# Patient Record
Sex: Female | Born: 1995 | Hispanic: Yes | Marital: Single | Smoking: Never smoker
Health system: Southern US, Community
[De-identification: ages and names within clinical notes are randomized; demographics above are authoritative.]

## PROBLEM LIST (undated history)

## (undated) ENCOUNTER — Inpatient Hospital Stay (HOSPITAL_COMMUNITY): Payer: Self-pay

## (undated) DIAGNOSIS — J45909 Unspecified asthma, uncomplicated: Secondary | ICD-10-CM

## (undated) DIAGNOSIS — A749 Chlamydial infection, unspecified: Secondary | ICD-10-CM

## (undated) HISTORY — PX: NO PAST SURGERIES: SHX2092

## (undated) HISTORY — DX: Chlamydial infection, unspecified: A74.9

---
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
## 2017-01-04 ENCOUNTER — Other Ambulatory Visit (INDEPENDENT_AMBULATORY_CARE_PROVIDER_SITE_OTHER): Payer: Medicaid Other | Admitting: *Deleted

## 2017-01-04 DIAGNOSIS — Z9889 Other specified postprocedural states: Secondary | ICD-10-CM

## 2017-01-04 NOTE — Progress Notes (Signed)
Pt here 2 weeks post partum for c-section incision check.  Area is well closed without drainage.  No redness or tender noted.  Pt is aware that the numbness will continue to be there but will get some better over time.  Appt for her PP check was made.  Pt will call if she has any further issues.

## 2017-01-17 ENCOUNTER — Encounter: Payer: Self-pay | Admitting: Obstetrics and Gynecology

## 2017-01-17 ENCOUNTER — Ambulatory Visit (INDEPENDENT_AMBULATORY_CARE_PROVIDER_SITE_OTHER): Payer: Medicaid Other | Admitting: Obstetrics and Gynecology

## 2017-01-17 DIAGNOSIS — Z3202 Encounter for pregnancy test, result negative: Secondary | ICD-10-CM

## 2017-01-17 DIAGNOSIS — Z3049 Encounter for surveillance of other contraceptives: Secondary | ICD-10-CM

## 2017-01-17 DIAGNOSIS — Z98891 History of uterine scar from previous surgery: Secondary | ICD-10-CM

## 2017-01-17 DIAGNOSIS — Z30017 Encounter for initial prescription of implantable subdermal contraceptive: Secondary | ICD-10-CM

## 2017-01-17 DIAGNOSIS — Z1389 Encounter for screening for other disorder: Secondary | ICD-10-CM | POA: Diagnosis not present

## 2017-01-17 DIAGNOSIS — Z01812 Encounter for preprocedural laboratory examination: Secondary | ICD-10-CM

## 2017-01-17 LAB — POCT URINE PREGNANCY: PREG TEST UR: NEGATIVE

## 2017-01-17 MED ORDER — ETONOGESTREL 68 MG ~~LOC~~ IMPL
68.0000 mg | DRUG_IMPLANT | Freq: Once | SUBCUTANEOUS | Status: AC
  Administered 2017-01-17: 68 mg via SUBCUTANEOUS

## 2017-01-17 NOTE — Progress Notes (Signed)
Subjective:     Cheyenne Garcia is a 21 y.o. female who presents for a postpartum visit. She is 4 weeks postpartum following a low cervical transverse Cesarean section. I have fully reviewed the prenatal and intrapartum course. The delivery was at 38 gestational weeks complicated by [facility] and IUGR. Outcome: primary cesarean section, low transverse incision. Anesthesia: spinal. Postpartum course has been uncomplicated. Baby's course has been uncomplicated. Baby is feeding by bottle Rush Barer- Gerber. Bleeding no bleeding. Bowel function is normal. Bladder function is normal. Patient is not sexually active. Contraception method is abstinence. Postpartum depression screening: negative.     Review of Systems Pertinent items are noted in HPI.   Objective:    Ht 5' (1.524 m)   Wt 119 lb (54 kg)   LMP 03/30/2016   Breastfeeding? No   BMI 23.24 kg/m   General:  alert, cooperative and no distress   Breasts:  inspection negative, no nipple discharge or bleeding, no masses or nodularity palpable  Lungs: clear to auscultation bilaterally  Heart:  regular rate and rhythm  Abdomen: soft, non-tender; bowel sounds normal; no masses,  no organomegaly and incision: healing well, without erythema, induration or drainage   Vulva:  normal  Vagina: normal vagina, no discharge, exudate, lesion, or erythema  Cervix:  multiparous appearance  Corpus: normal size, contour, position, consistency, mobility, non-tender  Adnexa:  normal adnexa and no mass, fullness, tenderness  Rectal Exam: Not performed.        Assessment:     Normal postpartum exam. Pap smear not done at today's visit.   Plan:    1. Contraception: Nexplanon  Patient given informed consent, signed copy in the chart, time out was performed. Pregnancy test was negative. Patient informed of irregular bleeding pattern associate with Nexplanon which does not affect its effectiveness. Patient advised to keep device in place for at least a year. Patient to use  condoms for the next 3 weeks Appropriate time out taken.  Patient's left arm was prepped and draped in the usual sterile fashion.. The ruler used to measure and mark insertion area.  Patient was prepped with alcohol swab and then injected with 3 cc of 1% lidocaine with epinephrine.  Patient was prepped with betadine, Nexplanon removed form packaging.  Device confirmed in needle, then inserted full length of needle and withdrawn per handbook instructions.  Patient insertion site covered with steri-strip and pressure bandage.   Minimal blood loss.  Patient tolerated the procedure well.   2. Patient is cleared to return to normal activities in 2 weeks.  3. Follow up in: 1 year or as needed.

## 2017-01-17 NOTE — Addendum Note (Signed)
Addended by: Granville LewisLARK, Subrina Vecchiarelli L on: 01/17/2017 03:25 PM   Modules accepted: Orders

## 2017-01-18 ENCOUNTER — Ambulatory Visit: Payer: Medicaid Other | Admitting: Obstetrics & Gynecology

## 2017-08-27 ENCOUNTER — Ambulatory Visit (INDEPENDENT_AMBULATORY_CARE_PROVIDER_SITE_OTHER): Payer: Self-pay | Admitting: Obstetrics & Gynecology

## 2017-08-27 ENCOUNTER — Encounter: Payer: Self-pay | Admitting: Obstetrics & Gynecology

## 2017-08-27 ENCOUNTER — Encounter (INDEPENDENT_AMBULATORY_CARE_PROVIDER_SITE_OTHER): Payer: Self-pay

## 2017-08-27 VITALS — BP 109/69 | HR 81 | Resp 16 | Ht 60.0 in | Wt 105.0 lb

## 2017-08-27 DIAGNOSIS — N644 Mastodynia: Secondary | ICD-10-CM

## 2017-08-27 NOTE — Progress Notes (Signed)
   Subjective:    Patient ID: Cheyenne Garcia, female    DOB: 05/28/1995, 22 y.o.   MRN: [id number]  HPI 22 yo P701 (288 month old son) here because of a 1 week h/o upper left breast discomfort with "running", "lifting things". She reports that the pain stops when she quits the activity that caused the pain. She has not taken any IBU or tylenol.  She breast fed her son for 2 months, uses Nexplanon for contraception. She denies any breast discharge, skin changes, or family history of breast cancer.    Review of Systems     Objective:   Physical Exam Breathing, conversing, and ambulating normally Well nourished, well hydrated Latina, no apparent distress  Her breasts appear normal. There are no masses, lymphadenopathy, nipple discharge, or skin changes.  When I palpate cephalad to the breast tissue she experiences some discomfort.     Assessment & Plan:  Probable musculoskeletal etiology of her discomfort I have rec'd RTC IBU 400 mg TID If her pain has not resolved by next week, I would rec that she come back

## 2020-02-03 ENCOUNTER — Other Ambulatory Visit: Payer: Self-pay

## 2020-02-03 DIAGNOSIS — U071 COVID-19: Secondary | ICD-10-CM | POA: Diagnosis not present

## 2020-02-06 DIAGNOSIS — Z419 Encounter for procedure for purposes other than remedying health state, unspecified: Secondary | ICD-10-CM | POA: Diagnosis not present

## 2020-02-21 DIAGNOSIS — H5213 Myopia, bilateral: Secondary | ICD-10-CM | POA: Diagnosis not present

## 2020-03-08 DIAGNOSIS — Z419 Encounter for procedure for purposes other than remedying health state, unspecified: Secondary | ICD-10-CM | POA: Diagnosis not present

## 2020-03-15 ENCOUNTER — Ambulatory Visit: Payer: Medicaid Other | Admitting: Certified Nurse Midwife

## 2020-03-18 ENCOUNTER — Encounter: Payer: Self-pay | Admitting: Certified Nurse Midwife

## 2020-03-18 ENCOUNTER — Ambulatory Visit: Payer: Medicaid Other | Admitting: Certified Nurse Midwife

## 2020-03-18 ENCOUNTER — Other Ambulatory Visit: Payer: Self-pay

## 2020-03-18 VITALS — BP 110/70 | HR 92 | Resp 16 | Ht 60.0 in | Wt 102.0 lb

## 2020-03-18 DIAGNOSIS — Z01812 Encounter for preprocedural laboratory examination: Secondary | ICD-10-CM

## 2020-03-18 DIAGNOSIS — Z30017 Encounter for initial prescription of implantable subdermal contraceptive: Secondary | ICD-10-CM

## 2020-03-18 DIAGNOSIS — Z3202 Encounter for pregnancy test, result negative: Secondary | ICD-10-CM | POA: Diagnosis not present

## 2020-03-18 DIAGNOSIS — Z3046 Encounter for surveillance of implantable subdermal contraceptive: Secondary | ICD-10-CM | POA: Diagnosis not present

## 2020-03-18 LAB — POCT URINE PREGNANCY: Preg Test, Ur: NEGATIVE

## 2020-03-18 MED ORDER — ETONOGESTREL 68 MG ~~LOC~~ IMPL
68.0000 mg | DRUG_IMPLANT | Freq: Once | SUBCUTANEOUS | Status: AC
  Administered 2020-03-18: 68 mg via SUBCUTANEOUS

## 2020-03-18 NOTE — Progress Notes (Signed)
   GYNECOLOGY OFFICE VISIT NOTE  History:  25 y.o. G1P1001 here today for Nexplanon removal and reinsertion. Had last Nexplanon placed 01/17/2017. She denies any abnormal vaginal discharge, bleeding, pelvic pain or other concerns.   Past Medical History:  Diagnosis Date  . Asthma   . Chlamydia     Past Surgical History:  Procedure Laterality Date  . CESAREAN SECTION N/A 12/22/2016   Procedure: CESAREAN SECTION;  Surgeon: Tereso Newcomer, MD;  [facility];  Service: Obstetrics;  Laterality: N/A;  . NO PAST SURGERIES      The following portions of the patient's history were reviewed and updated as appropriate: allergies, current medications, past family history, past medical history, past social history, past surgical history and problem list.   Review of Systems:  Negative except noted in HPI  Objective:  Physical Exam BP 110/70   Pulse 92   Resp 16   Ht 5' (1.524 m)   Wt 102 lb (46.3 kg)   Breastfeeding No   BMI 19.92 kg/m  CONSTITUTIONAL: Well-developed, well-nourished female in no acute distress.  HENT:  Normocephalic, atraumatic EYES: Conjunctivae and EOM are normal NECK: Normal range of motion SKIN: Skin is warm and dry NEUROLOGIC: Alert and oriented to person, place, and time PSYCHIATRIC: Normal mood and affect CARDIOVASCULAR: Normal heart rate noted RESPIRATORY: Effort and rate normal MUSCULOSKELETAL: Normal range of motion  Labs and Imaging Results for orders placed or performed in visit on 03/18/20 (from the past 24 hour(s))  POCT urine pregnancy     Status: Normal   Collection Time: 03/18/20  9:59 AM  Result Value Ref Range   Preg Test, Ur Negative Negative   GYNECOLOGY PROCEDURE NOTE  Nexplanon Removal and Insertion  Patient identified, informed consent performed, consent signed. Patient does understand that irregular bleeding is a very common side effect of this medication. She was advised to have backup contraception for one week  after replacement of the implant. Pregnancy test in clinic today was negative.  Appropriate time out taken. Implanon site identified. Area prepped in usual sterile fashon. One ml of 1% lidocaine was used to anesthetize the area at the distal end of the implant. A small stab incision was made right beside the implant on the distal portion. The Nexplanon rod was grasped using hemostats and removed without difficulty. There was minimal blood loss. There were no complications.   The new area was then injected with 3 ml of 1 % lidocaine. She was re-prepped with betadine, Nexplanon removed from packaging, device confirmed in needle, then inserted full length of needle and withdrawn per handbook instructions. Nexplanon was able to palpated in the patient's arm; patient palpated the insert herself. There was minimal blood loss. Patient insertion site covered with gauze and a pressure bandage to reduce any bruising. The patient tolerated the procedure well and was given post procedure instructions.  She was advised to have backup contraception for one week.    Donette Larry, CNM [facility] [employer], [facility]   Assessment & Plan:   1. Nexplanon removal   2. Nexplanon insertion    Follow up for annual and papsmear   Donette Larry, [place] 03/18/2020 2:17 PM

## 2020-04-05 DIAGNOSIS — Z419 Encounter for procedure for purposes other than remedying health state, unspecified: Secondary | ICD-10-CM | POA: Diagnosis not present

## 2020-04-26 ENCOUNTER — Other Ambulatory Visit: Payer: Self-pay

## 2020-04-26 NOTE — Patient Instructions (Signed)
Visit Information  Ms. Cheyenne Garcia  - as a part of your Medicaid benefit, you are eligible for care management and care coordination services at no cost or copay. I was unable to reach you by phone today but would be happy to help you with your health related needs. Please feel free to call me @ [phone].   A member of the Managed Medicaid care management team will reach out to you again over the next 7 days.   Alexis Shields, BSW, MHA [facility]  [place]  High Risk Managed Medicaid Team    

## 2020-04-26 NOTE — Patient Outreach (Signed)
Care Coordination  04/26/2020  Cheyenne Garcia 1995-11-09 [id number]   Medicaid Managed Care   Unsuccessful Outreach Note  04/26/2020 Name: Cheyenne Garcia MRN: [id number] DOB: 1996-01-29  Referred by: Patient, No Pcp Per Reason for referral : High Risk Managed Medicaid (MM Screen Unsuccessful Telephone Outreach)   An unsuccessful telephone outreach was attempted today. The patient was referred to the case management team for assistance with care management and care coordination.   Follow Up Plan: The care management team will reach out to the patient again over the next 7 days.   Gus Puma, BSW, [place] [facility]  [place]  High Risk Managed Medicaid Team

## 2020-05-05 ENCOUNTER — Other Ambulatory Visit: Payer: Self-pay

## 2020-05-05 NOTE — Patient Outreach (Signed)
Care Coordination  05/05/2020  Cheyenne Garcia 07/26/1995 [id number]   Medicaid Managed Care   Unsuccessful Outreach Note  05/05/2020 Name: Cheyenne Garcia MRN: [id number] DOB: 07/29/1995  Referred by: Patient, No Pcp Per (Inactive) Reason for referral : High Risk Managed Medicaid (MM Screen Unsuccessful Telephone Outreach)   A second unsuccessful telephone outreach was attempted today. The patient was referred to the case management team for assistance with care management and care coordination.   Follow Up Plan: The care management team will reach out to the patient again over the next 7 days.   Gus Puma, BSW, [place] [facility]  [place]  High Risk Managed Medicaid Team

## 2020-05-05 NOTE — Patient Instructions (Signed)
Visit Information  Ms. Cheyenne Garcia  - as a part of your Medicaid benefit, you are eligible for care management and care coordination services at no cost or copay. I was unable to reach you by phone today but would be happy to help you with your health related needs. Please feel free to call me @ [phone].   A member of the Managed Medicaid care management team will reach out to you again over the next 7 days.   Gus Puma, BSW, [place] [facility]  [place]  High Risk Managed Medicaid Team

## 2020-05-06 DIAGNOSIS — Z419 Encounter for procedure for purposes other than remedying health state, unspecified: Secondary | ICD-10-CM | POA: Diagnosis not present

## 2020-05-16 ENCOUNTER — Telehealth: Payer: Self-pay

## 2020-05-16 NOTE — Patient Outreach (Signed)
Care Coordination  05/16/2020  Cheyenne Garcia Apr 04, 1995 [id number]   Medicaid Managed Care   Unsuccessful Outreach Note  05/16/2020 Name: Cheyenne Garcia MRN: [id number] DOB: 1995-12-08  Referred by: Patient, No Pcp Per (Inactive) Reason for referral : High Risk Managed Medicaid (MM Screen Social Work)   Third unsuccessful telephone outreach was attempted today. The patient was referred to the case management team for assistance with care management and care coordination. The patient's primary care provider has been notified of our unsuccessful attempts to make or maintain contact with the patient. The care management team is pleased to engage with this patient at any time in the future should he/she be interested in assistance from the care management team.   Follow Up Plan: If patient returns call to provider office, please advise to call Embedded Care Management Care Guide [phone]* at social worker *  Gus Puma, BSW, [place] [facility]  [place]  High Risk Managed Medicaid Team

## 2020-05-16 NOTE — Patient Instructions (Signed)
Visit Information  Ms. Cheyenne Garcia  - as a part of your Medicaid benefit, you are eligible for care management and care coordination services at no cost or copay. I was unable to reach you by phone today but would be happy to help you with your health related needs. Please feel free to call me @ [phone].      Gus Puma, BSW, [place] [facility]  [place]  High Risk Managed Medicaid Team

## 2020-06-05 DIAGNOSIS — Z419 Encounter for procedure for purposes other than remedying health state, unspecified: Secondary | ICD-10-CM | POA: Diagnosis not present

## 2020-07-06 DIAGNOSIS — Z419 Encounter for procedure for purposes other than remedying health state, unspecified: Secondary | ICD-10-CM | POA: Diagnosis not present

## 2020-08-05 DIAGNOSIS — Z419 Encounter for procedure for purposes other than remedying health state, unspecified: Secondary | ICD-10-CM | POA: Diagnosis not present

## 2020-08-25 DIAGNOSIS — R531 Weakness: Secondary | ICD-10-CM | POA: Diagnosis not present

## 2020-09-05 DIAGNOSIS — Z419 Encounter for procedure for purposes other than remedying health state, unspecified: Secondary | ICD-10-CM | POA: Diagnosis not present

## 2020-10-06 DIAGNOSIS — Z419 Encounter for procedure for purposes other than remedying health state, unspecified: Secondary | ICD-10-CM | POA: Diagnosis not present

## 2020-11-05 DIAGNOSIS — Z419 Encounter for procedure for purposes other than remedying health state, unspecified: Secondary | ICD-10-CM | POA: Diagnosis not present

## 2020-12-06 DIAGNOSIS — Z419 Encounter for procedure for purposes other than remedying health state, unspecified: Secondary | ICD-10-CM | POA: Diagnosis not present

## 2021-01-05 DIAGNOSIS — Z419 Encounter for procedure for purposes other than remedying health state, unspecified: Secondary | ICD-10-CM | POA: Diagnosis not present

## 2021-02-05 DIAGNOSIS — Z419 Encounter for procedure for purposes other than remedying health state, unspecified: Secondary | ICD-10-CM | POA: Diagnosis not present

## 2021-03-08 DIAGNOSIS — Z419 Encounter for procedure for purposes other than remedying health state, unspecified: Secondary | ICD-10-CM | POA: Diagnosis not present

## 2021-04-05 DIAGNOSIS — Z419 Encounter for procedure for purposes other than remedying health state, unspecified: Secondary | ICD-10-CM | POA: Diagnosis not present

## 2021-04-10 DIAGNOSIS — R7303 Prediabetes: Secondary | ICD-10-CM | POA: Insufficient documentation

## 2021-05-06 DIAGNOSIS — Z419 Encounter for procedure for purposes other than remedying health state, unspecified: Secondary | ICD-10-CM | POA: Diagnosis not present

## 2021-06-05 DIAGNOSIS — Z419 Encounter for procedure for purposes other than remedying health state, unspecified: Secondary | ICD-10-CM | POA: Diagnosis not present

## 2021-07-06 DIAGNOSIS — Z419 Encounter for procedure for purposes other than remedying health state, unspecified: Secondary | ICD-10-CM | POA: Diagnosis not present

## 2021-08-05 DIAGNOSIS — Z419 Encounter for procedure for purposes other than remedying health state, unspecified: Secondary | ICD-10-CM | POA: Diagnosis not present

## 2021-08-28 DIAGNOSIS — R87610 Atypical squamous cells of undetermined significance on cytologic smear of cervix (ASC-US): Secondary | ICD-10-CM | POA: Insufficient documentation

## 2021-09-05 DIAGNOSIS — Z419 Encounter for procedure for purposes other than remedying health state, unspecified: Secondary | ICD-10-CM | POA: Diagnosis not present

## 2021-10-06 DIAGNOSIS — Z419 Encounter for procedure for purposes other than remedying health state, unspecified: Secondary | ICD-10-CM | POA: Diagnosis not present

## 2021-11-05 DIAGNOSIS — Z419 Encounter for procedure for purposes other than remedying health state, unspecified: Secondary | ICD-10-CM | POA: Diagnosis not present

## 2021-11-08 ENCOUNTER — Ambulatory Visit: Payer: Medicaid Other | Admitting: Obstetrics & Gynecology

## 2021-12-06 DIAGNOSIS — Z419 Encounter for procedure for purposes other than remedying health state, unspecified: Secondary | ICD-10-CM | POA: Diagnosis not present

## 2022-01-05 DIAGNOSIS — Z419 Encounter for procedure for purposes other than remedying health state, unspecified: Secondary | ICD-10-CM | POA: Diagnosis not present

## 2022-02-05 DIAGNOSIS — Z419 Encounter for procedure for purposes other than remedying health state, unspecified: Secondary | ICD-10-CM | POA: Diagnosis not present

## 2022-03-08 DIAGNOSIS — Z419 Encounter for procedure for purposes other than remedying health state, unspecified: Secondary | ICD-10-CM | POA: Diagnosis not present

## 2022-04-06 DIAGNOSIS — Z419 Encounter for procedure for purposes other than remedying health state, unspecified: Secondary | ICD-10-CM | POA: Diagnosis not present

## 2022-05-07 DIAGNOSIS — Z419 Encounter for procedure for purposes other than remedying health state, unspecified: Secondary | ICD-10-CM | POA: Diagnosis not present

## 2022-06-06 DIAGNOSIS — Z419 Encounter for procedure for purposes other than remedying health state, unspecified: Secondary | ICD-10-CM | POA: Diagnosis not present

## 2022-06-11 ENCOUNTER — Encounter: Payer: Self-pay | Admitting: Certified Nurse Midwife

## 2022-07-04 ENCOUNTER — Other Ambulatory Visit (HOSPITAL_COMMUNITY)
Admission: RE | Admit: 2022-07-04 | Discharge: 2022-07-04 | Disposition: A | Discharge status: Home / Self Care | Payer: BC Managed Care – PPO | Source: Ambulatory Visit | Attending: Advanced Practice Midwife | Admitting: Advanced Practice Midwife

## 2022-07-04 ENCOUNTER — Encounter: Payer: Self-pay | Admitting: Advanced Practice Midwife

## 2022-07-04 ENCOUNTER — Ambulatory Visit (INDEPENDENT_AMBULATORY_CARE_PROVIDER_SITE_OTHER): Payer: BC Managed Care – PPO | Admitting: Advanced Practice Midwife

## 2022-07-04 VITALS — BP 119/78 | HR 82 | Ht 60.0 in | Wt 101.0 lb

## 2022-07-04 DIAGNOSIS — Z3169 Encounter for other general counseling and advice on procreation: Secondary | ICD-10-CM

## 2022-07-04 DIAGNOSIS — Z1151 Encounter for screening for human papillomavirus (HPV): Secondary | ICD-10-CM | POA: Diagnosis not present

## 2022-07-04 DIAGNOSIS — Z8759 Personal history of other complications of pregnancy, childbirth and the puerperium: Secondary | ICD-10-CM

## 2022-07-04 DIAGNOSIS — Z3046 Encounter for surveillance of implantable subdermal contraceptive: Secondary | ICD-10-CM

## 2022-07-04 DIAGNOSIS — Z01419 Encounter for gynecological examination (general) (routine) without abnormal findings: Secondary | ICD-10-CM | POA: Insufficient documentation

## 2022-07-04 DIAGNOSIS — R87810 Cervical high risk human papillomavirus (HPV) DNA test positive: Secondary | ICD-10-CM

## 2022-07-04 DIAGNOSIS — Z98891 History of uterine scar from previous surgery: Secondary | ICD-10-CM | POA: Diagnosis not present

## 2022-07-04 DIAGNOSIS — R87610 Atypical squamous cells of undetermined significance on cytologic smear of cervix (ASC-US): Secondary | ICD-10-CM

## 2022-07-04 MED ORDER — CEPHALEXIN 500 MG PO CAPS
500.0000 mg | ORAL_CAPSULE | Freq: Three times a day (TID) | ORAL | 0 refills | Status: AC
Start: 2022-07-04 — End: 2022-07-09

## 2022-07-04 MED ORDER — IBUPROFEN 600 MG PO TABS
600.0000 mg | ORAL_TABLET | Freq: Four times a day (QID) | ORAL | 1 refills | Status: DC | PRN
Start: 2022-07-04 — End: 2022-12-18

## 2022-07-04 NOTE — Patient Instructions (Signed)
NEXPLANON REMOVAL POST-PROCEDURE INSTRUCTIONS  Keep the pressure dressing on for 24 hours. If it is too tight you may loosen it. Keep a band-aid on the site for five to seven days.  You may take Ibuprofen, Aleve or Tylenol for pain if needed.    You may have a small amount of bleeding from the site.  If you are not planning to get pregnant you should start other birth control immediately to avoid unplanned pregnancy.  Call the office if you have fever greater that 100.4, significant bleeding, redness, swelling or pus draining from the incision.   Keep area dry for 24 hours.    

## 2022-07-04 NOTE — Progress Notes (Signed)
GYNECOLOGY ANNUAL PREVENTATIVE CARE ENCOUNTER NOTE  History:     Cheyenne Garcia is a 27 y.o. G38P1001 female here for a routine annual gynecologic exam.  Current complaints: None. Would like to have Nexplanon removed due to trying to conceive.  Denies abnormal vaginal bleeding, discharge, pelvic pain, problems with intercourse or other gynecologic concerns.    Gynecologic History No LMP recorded. Patient has had an implant. Contraception: Nexplanon Last Pap: 2023. Results were: ASCUS with positive HR HPV Last mammogram: none. Results were: NA  Obstetric History OB History  Gravida Para Term Preterm AB Living  1 1 1     1   SAB IAB Ectopic Multiple Live Births        0 1    # Outcome Date GA Lbr Len/2nd Weight Sex Delivery Anes PTL Lv  1 Term 12/22/16 [redacted]w[redacted]d  5 lb 5 oz (2.41 kg) M CS-LTranv EPI  LIV     Birth Comments: SGA    Past Medical History:  Diagnosis Date   Asthma    Chlamydia     Past Surgical History:  Procedure Laterality Date   CESAREAN SECTION N/A 12/22/2016   Procedure: CESAREAN SECTION;  Surgeon: Tereso Newcomer, MD;  [facility];  Service: Obstetrics;  Laterality: N/A;   NO PAST SURGERIES      Current Outpatient Medications on File Prior to Visit  Medication Sig Dispense Refill   etonogestrel (NEXPLANON) 68 MG IMPL implant Inject into the skin.     albuterol (PROVENTIL HFA;VENTOLIN HFA) 108 (90 BASE) MCG/ACT inhaler Inhale 2 puffs into the lungs every 6 (six) hours as needed for wheezing. (Patient not taking: Reported on 07/04/2022)     No current facility-administered medications on file prior to visit.    No Known Allergies  Social History:  reports that she has never smoked. She has never used smokeless tobacco. She reports that she does not drink alcohol and does not use drugs.  History reviewed. No pertinent family history.  The following portions of the patient's history were reviewed and updated as appropriate: allergies,  current medications, past family history, past medical history, past social history, past surgical history and problem list.  Review of Systems Review of Systems  Constitutional:  Negative for chills and fever.  Gastrointestinal:  Negative for abdominal pain.  Genitourinary:  Negative for dyspareunia, vaginal bleeding and vaginal discharge.  Skin: Negative.        Neg for breast masses or skin or nipple changes     Physical Exam:  BP 119/78   Pulse 82   Ht 5' (1.524 m)   Wt 101 lb (45.8 kg)   BMI 19.73 kg/m  CONSTITUTIONAL: Well-developed, well-nourished female in no acute distress.  HENT:  Normocephalic, atraumatic. Oropharynx is clear and moist EYES: Conjunctivae normal. No scleral icterus.  SKIN: Skin is warm and dry. No rash noted. Not diaphoretic. No erythema. No pallor. MUSCULOSKELETAL: Normal range of motion. No tenderness.  No cyanosis or edema.   NEUROLOGIC: Alert and oriented to person, place, and time. Normal muscle tone coordination.  PSYCHIATRIC: Normal mood and affect. Normal behavior. Normal judgment and thought content. CARDIOVASCULAR: Normal heart rate noted. RESPIRATORY: Effort and rate normal. BREASTS: Declined ABDOMEN: Soft, no distention, tenderness, rebound or guarding.  PELVIC: Normal appearing external genitalia; normal appearing vaginal mucosa and cervix.  No abnormal discharge noted.  Pap smear obtained.   Nexplanon Removal Procedure Note  Patient given informed consent for removal of her Nexplanon due to  planning pregnancy. Time out was performed and signed copy scanning into the chart. Nexplanon site identified on left arm. Area prepped in usual sterile fashon. One cc of 1% lidocaine was used to anesthetize the area at the distal end of the implant. A small stab incision was made w/ an 11 blade scalpel at the distal portion end. The Nexplanon rod was very encased in scar tissue and, although visible, was very difficult to grasp. Incision was enlarged and  scar tissue was carefully dissected from tip of Nexplanon. Offered to discontinue attempts and reschedule with MD but pt declined and requested that CNM continue since Nexplanon tip was visible. Additional Lidocaine was injected. Finally tip of Nexplanon was grasped using hemostats and removed without difficulty. Removal of entire 40 mm rod confirmed and shown to patient. There was less than 5 cc blood loss. There were no complications. A pressure bandage was applied to reduce any bruising. Due more than typical manipulation during procedure, Rx Keflex given for infection prophylaxis. The patient tolerated the procedure very well and was given post procedure instructions. Offered 1 week F/U appt due to difficult removal but pt states she will call if appt is needed.  Ibuprofen and Keflex Rx's sent to pharmacy.     Assessment and Plan:    1. Well woman exam  - Cytology - PAP  2. Nexplanon removal  - cephALEXin (KEFLEX) 500 MG capsule; Take 1 capsule (500 mg total) by mouth 3 (three) times daily for 5 days.  Dispense: 15 capsule; Refill: 0 - ibuprofen (ADVIL) 600 MG tablet; Take 1 tablet (600 mg total) by mouth every 6 (six) hours as needed.  Dispense: 30 tablet; Refill: 1  3. Encounter for well woman exam with routine gynecological exam   4. Encounter for preconception consultation - Recommend PNV or MVI w/ minimum 400 mcg Folic acid. Recommend healthy lifestyle.   Will follow up results of pap smear and manage accordingly. Mammogram not indicated.  Routine preventative health maintenance measures emphasized. Please refer to After Visit Summary for other counseling recommendations.  Interested in HPV vaccine. Explained that it will not affect HPV that she already has but could prevent other HR HPVs and lower risk of cervical cancer. Will research if there needs to be a delay before TTC.      Dorathy Kinsman, CNM [facility] [employer], [facility]

## 2022-07-07 DIAGNOSIS — Z419 Encounter for procedure for purposes other than remedying health state, unspecified: Secondary | ICD-10-CM | POA: Diagnosis not present

## 2022-07-10 LAB — CYTOLOGY - PAP
Chlamydia: NEGATIVE
Comment: NEGATIVE
Comment: NEGATIVE
Comment: NEGATIVE
Comment: NORMAL
Diagnosis: UNDETERMINED — AB
High risk HPV: NEGATIVE
Neisseria Gonorrhea: NEGATIVE
Trichomonas: NEGATIVE

## 2022-07-12 NOTE — Progress Notes (Signed)
Your Pap smear still showed abnormal cells so I recommend a procedure called a Colposcopy. A Pap smear is a screening test and colposcopy is a diagnostic test that gives more information. The office will call you to tell you more about it.

## 2022-08-06 DIAGNOSIS — Z419 Encounter for procedure for purposes other than remedying health state, unspecified: Secondary | ICD-10-CM | POA: Diagnosis not present

## 2022-08-08 ENCOUNTER — Encounter: Payer: BC Managed Care – PPO | Admitting: Obstetrics and Gynecology

## 2022-08-30 ENCOUNTER — Ambulatory Visit (INDEPENDENT_AMBULATORY_CARE_PROVIDER_SITE_OTHER): Payer: BC Managed Care – PPO | Admitting: Obstetrics and Gynecology

## 2022-08-30 ENCOUNTER — Encounter: Payer: Self-pay | Admitting: Obstetrics and Gynecology

## 2022-08-30 VITALS — BP 129/83 | HR 90 | Ht 60.0 in | Wt 100.0 lb

## 2022-08-30 DIAGNOSIS — Z01812 Encounter for preprocedural laboratory examination: Secondary | ICD-10-CM

## 2022-08-30 LAB — POCT URINE PREGNANCY: Preg Test, Ur: NEGATIVE

## 2022-08-30 NOTE — Progress Notes (Signed)
Pt presented for colpo.   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 Gen: A&Ox3, NAD CV: Normal rate Resp: Normal respiratory effort  A/P: Discussed that algorithm varies based on age.  Given that her HPV+ pap was age < 59, that algorithm allows for additional ASCUS/LSIL pap in 1 year before colposcopy.  Reviewed that this makes sense physiologically - she was exposed to HPV, her body has cleared the acute infection and needs time for pap to normalize Plan for pap in 1 year. If ASCUS+, will colpo at that time

## 2022-09-06 DIAGNOSIS — Z419 Encounter for procedure for purposes other than remedying health state, unspecified: Secondary | ICD-10-CM | POA: Diagnosis not present

## 2022-10-07 DIAGNOSIS — Z419 Encounter for procedure for purposes other than remedying health state, unspecified: Secondary | ICD-10-CM | POA: Diagnosis not present

## 2022-10-17 DIAGNOSIS — Z3200 Encounter for pregnancy test, result unknown: Secondary | ICD-10-CM | POA: Diagnosis not present

## 2022-10-29 ENCOUNTER — Other Ambulatory Visit: Payer: Self-pay | Admitting: *Deleted

## 2022-11-06 DIAGNOSIS — Z419 Encounter for procedure for purposes other than remedying health state, unspecified: Secondary | ICD-10-CM | POA: Diagnosis not present

## 2022-11-14 ENCOUNTER — Encounter: Payer: Self-pay | Admitting: *Deleted

## 2022-11-14 DIAGNOSIS — Z3490 Encounter for supervision of normal pregnancy, unspecified, unspecified trimester: Secondary | ICD-10-CM | POA: Insufficient documentation

## 2022-11-16 ENCOUNTER — Encounter: Payer: Self-pay | Admitting: Obstetrics and Gynecology

## 2022-11-16 ENCOUNTER — Other Ambulatory Visit: Payer: Medicaid Other

## 2022-11-16 ENCOUNTER — Ambulatory Visit (INDEPENDENT_AMBULATORY_CARE_PROVIDER_SITE_OTHER): Payer: Medicaid Other | Admitting: Obstetrics and Gynecology

## 2022-11-16 VITALS — BP 132/82 | HR 80 | Wt 107.0 lb

## 2022-11-16 DIAGNOSIS — Z3401 Encounter for supervision of normal first pregnancy, first trimester: Secondary | ICD-10-CM

## 2022-11-16 DIAGNOSIS — Z3490 Encounter for supervision of normal pregnancy, unspecified, unspecified trimester: Secondary | ICD-10-CM

## 2022-11-16 DIAGNOSIS — R7303 Prediabetes: Secondary | ICD-10-CM | POA: Diagnosis not present

## 2022-11-16 DIAGNOSIS — Z3481 Encounter for supervision of other normal pregnancy, first trimester: Secondary | ICD-10-CM

## 2022-11-16 DIAGNOSIS — Z3A09 9 weeks gestation of pregnancy: Secondary | ICD-10-CM | POA: Diagnosis not present

## 2022-11-16 DIAGNOSIS — Z1339 Encounter for screening examination for other mental health and behavioral disorders: Secondary | ICD-10-CM | POA: Diagnosis not present

## 2022-11-16 DIAGNOSIS — R87610 Atypical squamous cells of undetermined significance on cytologic smear of cervix (ASC-US): Secondary | ICD-10-CM

## 2022-11-16 DIAGNOSIS — Z3A10 10 weeks gestation of pregnancy: Secondary | ICD-10-CM | POA: Diagnosis not present

## 2022-11-16 MED ORDER — METOCLOPRAMIDE HCL 10 MG PO TABS
10.0000 mg | ORAL_TABLET | Freq: Four times a day (QID) | ORAL | 1 refills | Status: DC
Start: 2022-11-16 — End: 2023-06-07

## 2022-11-16 NOTE — Progress Notes (Signed)
History:   Cheyenne Garcia is a 27 y.o. G2P1001 at [redacted]w[redacted]d by LMP being seen today for her first obstetrical visit.  Her obstetrical history is significant for intrauterine growth restriction (IUGR) and pregnancy induced hypertension. Patient does intend to breast feed. Pregnancy history fully reviewed.  Patient reports nausea.  Previous primary C/s due to fetal intolerance of labor along with recurrent fetal decelerations.  Previous baby weight: 5 lbs 5 ounces Severe IUGR, HTN at the end of pregnancy. Induced because of IUGR and elevated dopperls.  BP started in the 3rd trimester @ 33 weeks and remained elevated during labor. No severe preeclampsia diagnosed.  No history of cHTN.   HISTORY: OB History  Gravida Para Term Preterm AB Living  2 1 1  0 0 1  SAB IAB Ectopic Multiple Live Births  0 0 0 0 1    # Outcome Date GA Lbr Len/2nd Weight Sex Type Anes PTL Lv  2 Current           1 Term 12/22/16 [redacted]w[redacted]d  5 lb 5 oz (2.41 kg) M CS-LTranv EPI  LIV     Birth Comments: SGA     Name: Cheyenne Garcia,Cheyenne Garcia     Apgar1: 8  Apgar5: 10    Last pap smear was done abnormal and was normal ASC-US.   Past Medical History:  Diagnosis Date   Asthma    Chlamydia    History of gestational hypertension 12/07/2016   36 weeks 133/93 [ ] Labs drawn   S/P cesarean section 07/09/2016      Clinic  [facility]  Prenatal Labs  Dating   LMP  Blood type: O/POS/-- (06/04 1147)   Genetic Screen  1 Screen:    AFP:     Quad:     NIPS:  Antibody:NEG (06/04 1147)  Anatomic [place]  nml except UTD f/u US> resolved; 32 wks> EFW 19th %tile; AC 7th %tile >wkly BPP and UA doppler    Rubella: 1.64 (06/04 1147)  GTT  Early:               Third trimester: nml  RPR: NON REAC (06/04 1147)   Flu vaccine   de   Past Surgical History:  Procedure Laterality Date   CESAREAN SECTION N/A 12/22/2016   Procedure: CESAREAN SECTION;  Surgeon: Tereso Newcomer, MD;  Location: [facility];  Service: Obstetrics;  Laterality: N/A;   NO PAST  SURGERIES     History reviewed. No pertinent family history. Social History   Tobacco Use   Smoking status: Never   Smokeless tobacco: Never  Vaping Use   Vaping status: Never Used  Substance Use Topics   Alcohol use: No   Drug use: No   No Known Allergies Current Outpatient Medications on File Prior to Visit  Medication Sig Dispense Refill   Prenatal Vit-Fe Fumarate-FA (MULTIVITAMIN-PRENATAL) 27-0.8 MG TABS tablet Take 1 tablet by mouth daily at 12 noon.     albuterol (PROVENTIL HFA;VENTOLIN HFA) 108 (90 BASE) MCG/ACT inhaler Inhale 2 puffs into the lungs every 6 (six) hours as needed for wheezing. (Patient not taking: Reported on 07/04/2022)     ibuprofen (ADVIL) 600 MG tablet Take 1 tablet (600 mg total) by mouth every 6 (six) hours as needed. (Patient not taking: Reported on 08/30/2022) 30 tablet 1   No current facility-administered medications on file prior to visit.    Review of Systems Pertinent items noted in HPI and remainder of comprehensive ROS otherwise negative.  Indications for  ASA therapy (per UpToDate) One of the following: Previous pregnancy with preeclampsia, especially early onset and with an adverse outcome No Multifetal gestation No Chronic hypertension No Type 1 or 2 diabetes mellitus No Chronic kidney disease No Autoimmune disease (antiphospholipid syndrome, systemic lupus erythematosus) No Two or more of the following: Nulliparity No Obesity (body mass index >30 kg/m2) No Family history of preeclampsia in mother or sister No Age >=35 years No Sociodemographic characteristics (African American race, low socioeconomic level) No Personal risk factors (eg, previous pregnancy with low birth weight or small for gestational age infant, previous adverse pregnancy outcome [eg, stillbirth], interval >10 years between pregnancies) Yes   Physical Exam:   Vitals:   11/16/22 0817 11/16/22 1101  BP: (!) 144/84 132/82  Pulse: 82 80  Weight: 107 lb (48.5 kg)     General: well-developed, well-nourished female in no acute distress  Breasts:  normal appearance, no masses or tenderness bilaterally, exam done in the presence of a chaperone.   Skin: normal coloration and turgor, no rashes  Neurologic: oriented, normal, negative, normal mood  Extremities: normal strength, tone, and muscle mass, ROM of all joints is normal  HEENT PERRLA, extraocular movement intact and sclera clear, anicteric  Neck supple and no masses  Cardiovascular: regular rate and rhythm  Respiratory:  no respiratory distress, normal breath sounds  Abdomen: soft, non-tender; bowel sounds normal; no masses,  no organomegaly  Pelvic: Deferred .     Assessment:    Pregnancy: G2P1001 Patient Active Problem List   Diagnosis Date Noted   Supervision of normal pregnancy 11/14/2022   Atypical squamous cells of undetermined significance (ASCUS) on Papanicolaou smear of cervix 08/28/2021   History of gestational hypertension 12/07/2016   Previous baby with fetal growth restriction 11/30/2016   S/P cesarean section 07/09/2016     Plan:    1. Encounter for supervision of normal pregnancy, antepartum, unspecified gravidity  - Desires TOLAC  - US OB Limited; Future - Pregnancy, Initial Screen - [place] MFM OB COMP + 14 WK; Future - Babyscripts Schedule Optimization - BASA daily - Basline HTN labs.  - Initial BP elevated, repeat is normal - deep dive into chart, no elevated BP readings noted that would indicate CHTN.   2. Encounter for supervision of normal first pregnancy in first trimester  - PANORAMA PRENATAL TEST  3. Atypical squamous cells of undetermined significance (ASCUS) on Papanicolaou smear of cervix  ASC-US in 2023 and 2024 with negative High risk HPV. She is 27 y/o Per ASCCP guidelines 1 year follow up is recommended.  If ASC-US would consider colpo.    Initial labs drawn. Continue prenatal vitamins. Problem list reviewed and updated. Genetic Screening  discussed, Panorama and Horizon: undecided. Ultrasound discussed; fetal anatomic survey: scheduled. Anticipatory guidance about prenatal visits given including labs, ultrasounds, and testing. Weight gain recommendations per IOM guidelines reviewed: underweight/BMI 18.5 or less > 28 - 40 lbs; normal weight/BMI 18.5 - 24.9 > 25 - 35 lbs; overweight/BMI 25 - 29.9 > 15 - 25 lbs; obese/BMI  30 or more > 11 - 20 lbs. Discussed usage of the Babyscripts app for more information about pregnancy, and to track blood pressures. Also discussed usage of virtual visits as additional source of managing and completing prenatal visits.  Patient was encouraged to use MyChart to review results, send requests, and have questions addressed.   The nature of [facility] Healthcare/Faculty Practice with multiple MDs and Advanced Practice Providers was explained to patient; also emphasized that  residents, students are part of our team. Routine obstetric precautions reviewed. Encouraged to seek out care at our office or emergency room Cheyenne Garcia preferred) for urgent and/or emergent concerns. No follow-ups on file.     Cheyenne Garcia, Harolyn Rutherford, NP Faculty Practice [facility] [employer], [facility]

## 2022-11-17 LAB — TSH: TSH: 2.14 u[IU]/mL (ref 0.450–4.500)

## 2022-11-17 LAB — PROTEIN / CREATININE RATIO, URINE
Creatinine, Urine: 47.7 mg/dL
Protein, Ur: 6.9 mg/dL
Protein/Creat Ratio: 145 mg/g{creat} (ref 0–200)

## 2022-11-17 LAB — COMPREHENSIVE METABOLIC PANEL
ALT: 16 [IU]/L (ref 0–32)
AST: 21 [IU]/L (ref 0–40)
Albumin: 4.5 g/dL (ref 4.0–5.0)
Alkaline Phosphatase: 65 [IU]/L (ref 44–121)
BUN/Creatinine Ratio: 15 (ref 9–23)
BUN: 7 mg/dL (ref 6–20)
Bilirubin Total: 0.3 mg/dL (ref 0.0–1.2)
CO2: 20 mmol/L (ref 20–29)
Calcium: 9.3 mg/dL (ref 8.7–10.2)
Chloride: 102 mmol/L (ref 96–106)
Creatinine, Ser: 0.46 mg/dL — ABNORMAL LOW (ref 0.57–1.00)
Globulin, Total: 2.8 g/dL (ref 1.5–4.5)
Glucose: 77 mg/dL (ref 70–99)
Potassium: 4.1 mmol/L (ref 3.5–5.2)
Sodium: 137 mmol/L (ref 134–144)
Total Protein: 7.3 g/dL (ref 6.0–8.5)
eGFR: 136 mL/min/{1.73_m2} (ref >59)

## 2022-11-17 LAB — HEMOGLOBIN A1C
Est. average glucose Bld gHb Est-mCnc: 111 mg/dL
Hgb A1c MFr Bld: 5.5 % (ref 4.8–5.6)

## 2022-11-20 LAB — PREGNANCY, INITIAL SCREEN
Antibody Screen: NEGATIVE
Basophils Absolute: 0.1 10*3/uL (ref 0.0–0.2)
Basos: 1 %
Bilirubin, UA: NEGATIVE
Chlamydia trachomatis, NAA: NEGATIVE
EOS (ABSOLUTE): 0.2 10*3/uL (ref 0.0–0.4)
Eos: 2 %
Glucose, UA: NEGATIVE
HCV Ab: NONREACTIVE
HIV Screen 4th Generation wRfx: NONREACTIVE
Hematocrit: 38.7 % (ref 34.0–46.6)
Hemoglobin: 12.5 g/dL (ref 11.1–15.9)
Hepatitis B Surface Ag: NEGATIVE
Immature Grans (Abs): 0 10*3/uL (ref 0.0–0.1)
Immature Granulocytes: 0 %
Ketones, UA: NEGATIVE
Leukocytes,UA: NEGATIVE
Lymphocytes Absolute: 2 10*3/uL (ref 0.7–3.1)
Lymphs: 19 %
MCH: 30 pg (ref 26.6–33.0)
MCHC: 32.3 g/dL (ref 31.5–35.7)
MCV: 93 fL (ref 79–97)
Monocytes Absolute: 0.8 10*3/uL (ref 0.1–0.9)
Monocytes: 7 %
Neisseria Gonorrhoeae by PCR: NEGATIVE
Neutrophils Absolute: 7.5 10*3/uL — ABNORMAL HIGH (ref 1.4–7.0)
Neutrophils: 71 %
Nitrite, UA: NEGATIVE
Platelets: 315 10*3/uL (ref 150–450)
Protein,UA: NEGATIVE
RBC, UA: NEGATIVE
RBC: 4.16 x10E6/uL (ref 3.77–5.28)
RDW: 13.9 % (ref 11.7–15.4)
RPR Ser Ql: NONREACTIVE
Rh Factor: POSITIVE
Rubella Antibodies, IGG: 1.53 {index} (ref >0.99)
Specific Gravity, UA: 1.01 (ref 1.005–1.030)
Urobilinogen, Ur: 0.2 mg/dL (ref 0.2–1.0)
WBC: 10.5 10*3/uL (ref 3.4–10.8)
pH, UA: 7 (ref 5.0–7.5)

## 2022-11-20 LAB — URINE CULTURE, OB REFLEX

## 2022-11-20 LAB — MICROSCOPIC EXAMINATION
Bacteria, UA: NONE SEEN
Casts: NONE SEEN /[LPF]
WBC, UA: NONE SEEN /[HPF] (ref 0–5)

## 2022-11-20 LAB — HCV INTERPRETATION

## 2022-12-07 DIAGNOSIS — Z419 Encounter for procedure for purposes other than remedying health state, unspecified: Secondary | ICD-10-CM | POA: Diagnosis not present

## 2022-12-14 ENCOUNTER — Encounter: Payer: Medicaid Other | Admitting: Obstetrics and Gynecology

## 2022-12-18 ENCOUNTER — Ambulatory Visit (INDEPENDENT_AMBULATORY_CARE_PROVIDER_SITE_OTHER): Payer: Medicaid Other | Admitting: Obstetrics and Gynecology

## 2022-12-18 VITALS — BP 117/74 | HR 86 | Wt 111.0 lb

## 2022-12-18 DIAGNOSIS — Z3490 Encounter for supervision of normal pregnancy, unspecified, unspecified trimester: Secondary | ICD-10-CM

## 2022-12-18 NOTE — Progress Notes (Signed)
   PRENATAL VISIT NOTE  Subjective:  Cheyenne Garcia is a 27 y.o. G2P1001 at [redacted]w[redacted]d being seen today for ongoing prenatal care.  She is currently monitored for the following issues for this low-risk pregnancy and has S/P cesarean section; Previous baby with fetal growth restriction; History of gestational hypertension; Atypical squamous cells of undetermined significance (ASCUS) on Papanicolaou smear of cervix; and Supervision of normal pregnancy on their problem list.  Patient reports no complaints.  Contractions: Not present. Vag. Bleeding: None.  Movement: Present. Denies leaking of fluid.   The following portions of the patient's history were reviewed and updated as appropriate: allergies, current medications, past family history, past medical history, past social history, past surgical history and problem list.   Objective:   Vitals:   12/18/22 0931  BP: 117/74  Pulse: 86  Weight: 111 lb (50.3 kg)    Fetal Status: Fetal Heart Rate (bpm): 160   Movement: Present     General:  Alert, oriented and cooperative. Patient is in no acute distress.  Skin: Skin is warm and dry. No rash noted.   Cardiovascular: Normal heart rate noted  Respiratory: Normal respiratory effort, no problems with respiration noted  Abdomen: Soft, gravid, appropriate for gestational age.  Pain/Pressure: Absent     Pelvic: Cervical exam deferred        Extremities: Normal range of motion.  Edema: None  Mental Status: Normal mood and affect. Normal behavior. Normal judgment and thought content.   Assessment and Plan:  Pregnancy: G2P1001 at [redacted]w[redacted]d  1. Encounter for supervision of normal pregnancy, antepartum, unspecified gravidity  Doing well BP good Will start BASA 81 mg MFM [place] scheduled.  Declined flu   Preterm labor symptoms and general obstetric precautions including but not limited to vaginal bleeding, contractions, leaking of fluid and fetal movement were reviewed in detail with the patient. Please  refer to After Visit Summary for other counseling recommendations.   No follow-ups on file.  Future Appointments  Date Time Provider Department Center  01/15/2023  9:50 AM Devann Cribb, Harolyn Rutherford, NP [facility]  01/18/2023  1:15 PM [facility] NURSE [facility]  01/18/2023  1:30 PM [facility] US3 [facility]    Venia Carbon, NP

## 2023-01-06 DIAGNOSIS — Z419 Encounter for procedure for purposes other than remedying health state, unspecified: Secondary | ICD-10-CM | POA: Diagnosis not present

## 2023-01-15 ENCOUNTER — Ambulatory Visit (INDEPENDENT_AMBULATORY_CARE_PROVIDER_SITE_OTHER): Payer: Medicaid Other | Admitting: Obstetrics and Gynecology

## 2023-01-15 VITALS — BP 129/76 | HR 105 | Wt 123.0 lb

## 2023-01-15 DIAGNOSIS — Z3490 Encounter for supervision of normal pregnancy, unspecified, unspecified trimester: Secondary | ICD-10-CM | POA: Diagnosis not present

## 2023-01-15 DIAGNOSIS — Z3A18 18 weeks gestation of pregnancy: Secondary | ICD-10-CM

## 2023-01-15 DIAGNOSIS — Z3482 Encounter for supervision of other normal pregnancy, second trimester: Secondary | ICD-10-CM

## 2023-01-15 MED ORDER — ASPIRIN 81 MG PO CHEW: 81.0000 mg | CHEWABLE_TABLET | Freq: Every day | ORAL | 1 refills | Status: DC

## 2023-01-15 NOTE — Progress Notes (Signed)
   PRENATAL VISIT NOTE  Subjective:  Cheyenne Garcia is a 27 y.o. G2P1001 at [redacted]w[redacted]d being seen today for ongoing prenatal care.  She is currently monitored for the following issues for this low-risk pregnancy and has S/P cesarean section; Previous baby with fetal growth restriction; History of gestational hypertension; Atypical squamous cells of undetermined significance (ASCUS) on Papanicolaou smear of cervix; and Supervision of normal pregnancy on their problem list.  Patient reports no complaints.  Contractions: Not present. Vag. Bleeding: None.  Movement: Present. Denies leaking of fluid.   The following portions of the patient's history were reviewed and updated as appropriate: allergies, current medications, past family history, past medical history, past social history, past surgical history and problem list.   Objective:   Vitals:   01/15/23 1001  BP: 129/76  Pulse: (!) 105  Weight: 123 lb (55.8 kg)    Fetal Status: Fetal Heart Rate (bpm): 137   Movement: Present     General:  Alert, oriented and cooperative. Patient is in no acute distress.  Skin: Skin is warm and dry. No rash noted.   Cardiovascular: Normal heart rate noted  Respiratory: Normal respiratory effort, no problems with respiration noted  Abdomen: Soft, gravid, appropriate for gestational age.  Pain/Pressure: Absent     Pelvic: Cervical exam deferred        Extremities: Normal range of motion.  Edema: None  Mental Status: Normal mood and affect. Normal behavior. Normal judgment and thought content.   Assessment and Plan:  Pregnancy: G2P1001 at [redacted]w[redacted]d 1. Encounter for supervision of normal pregnancy, antepartum, unspecified gravidity  MFM [place] scheduled MD visit do discuss TOLAC AFP today Declined flu.  BP looks good.  Start BASA, patient was unsure which brand to purchase. RX sent.   Preterm labor symptoms and general obstetric precautions including but not limited to vaginal bleeding, contractions, leaking of  fluid and fetal movement were reviewed in detail with the patient. Please refer to After Visit Summary for other counseling recommendations.   Return for MD visit at some point to discuss TOLAC, can be anytime. .  Future Appointments  Date Time Provider Department Center  01/18/2023  1:15 PM [facility] NURSE [facility]  01/18/2023  1:30 PM [facility] US3 [facility]    Venia Carbon, NP

## 2023-01-17 LAB — AFP, SERUM, OPEN SPINA BIFIDA
AFP MoM: 1.05
AFP Value: 55.5 ng/mL
Gest. Age on Collection Date: 18.6 wk
Maternal Age At EDD: 26.1 a
OSBR Risk 1 IN: 10000
Test Results:: NEGATIVE
Weight: 123 [lb_av]

## 2023-01-18 ENCOUNTER — Other Ambulatory Visit: Payer: Self-pay | Admitting: Obstetrics and Gynecology

## 2023-01-18 ENCOUNTER — Ambulatory Visit: Payer: Medicaid Other | Admitting: *Deleted

## 2023-01-18 ENCOUNTER — Other Ambulatory Visit: Payer: Self-pay | Admitting: *Deleted

## 2023-01-18 ENCOUNTER — Ambulatory Visit: Payer: Medicaid Other | Attending: Obstetrics and Gynecology

## 2023-01-18 VITALS — BP 130/81 | HR 90

## 2023-01-18 DIAGNOSIS — Z363 Encounter for antenatal screening for malformations: Secondary | ICD-10-CM | POA: Insufficient documentation

## 2023-01-18 DIAGNOSIS — Z3490 Encounter for supervision of normal pregnancy, unspecified, unspecified trimester: Secondary | ICD-10-CM

## 2023-01-18 DIAGNOSIS — O09292 Supervision of pregnancy with other poor reproductive or obstetric history, second trimester: Secondary | ICD-10-CM | POA: Diagnosis not present

## 2023-01-18 DIAGNOSIS — Z3492 Encounter for supervision of normal pregnancy, unspecified, second trimester: Secondary | ICD-10-CM | POA: Insufficient documentation

## 2023-01-18 DIAGNOSIS — R87610 Atypical squamous cells of undetermined significance on cytologic smear of cervix (ASC-US): Secondary | ICD-10-CM

## 2023-01-18 DIAGNOSIS — O321XX0 Maternal care for breech presentation, not applicable or unspecified: Secondary | ICD-10-CM | POA: Diagnosis not present

## 2023-01-18 DIAGNOSIS — R7303 Prediabetes: Secondary | ICD-10-CM

## 2023-01-18 DIAGNOSIS — Z3A19 19 weeks gestation of pregnancy: Secondary | ICD-10-CM | POA: Diagnosis not present

## 2023-01-18 DIAGNOSIS — O365920 Maternal care for other known or suspected poor fetal growth, second trimester, not applicable or unspecified: Secondary | ICD-10-CM

## 2023-01-18 DIAGNOSIS — Z3401 Encounter for supervision of normal first pregnancy, first trimester: Secondary | ICD-10-CM

## 2023-02-06 DIAGNOSIS — Z419 Encounter for procedure for purposes other than remedying health state, unspecified: Secondary | ICD-10-CM | POA: Diagnosis not present

## 2023-02-06 NOTE — L&D Delivery Note (Signed)
 OB/GYN Faculty Practice Delivery Note  Eric Plano is a 28 y.o. G2P1001 s/p VAVBAC at [redacted]w[redacted]d. She was admitted for induction of labor in the setting of gestational hypertension.   ROM: 10h 23m with moderate meconium fluid GBS Status:  Negative/-- (04/14 0000) Maximum Maternal Temperature: 99.3 F    Labor Progress: Patient arrived at 1 cm dilation and was induced with foley balloon and misoprostol . She had SROM at 0205 on day of delivery. Patient reached 10 cm. Prior to this FHT had shown intermittent recurrent late decels but always with good variability. Patient pushed with excellent effort for two hours, and brought baby down to around +2-3 station, however at this point variability in the [facility] became minimal and patient was counseled on vacuum assisted delivery. At discussing with her partner for several minuts she agreed to proceed with VAVD. Aaron Aas   Delivery Date/Time: 06/05/2023 at 1302  Operative Delivery Note Infant was delivered via Vacuum Assisted Vaginal Delivery due to non-reassuring fetal status.  The patient was examined and found to be Presentation: vertex; Position: Left,, Occiput,, Anterior; Station: +2.  Verbal consent: obtained from patient.  Risks and benefits discussed in detail.  Risks include, but are not limited to the risks of anesthesia, bleeding, infection, damage to maternal tissues, fetal cephalhematoma.  There is also the risk of inability to effect vaginal delivery of the head, or shoulder dystocia that cannot be resolved by established maneuvers, leading to the need for emergency cesarean section.  The Kiwi vacuum was positioned over the sagittal suture 3 cm anterior to posterior fontanelle.  Pressure was then increased to 500 mmHg, and the patient was instructed to push.  Pulling was administered along the pelvic curve.  2 pulls were administered during 2 contractions, with release of pressure between contractions.  No popoffs.  The infant was then delivered  atraumatically.  Head delivered in LOA position. Extremely tight nuchal x1, unable to be reduced. Shoulder and body delivered via somersault maneuver. Infant  initially placed on mother's abdomen, dried and stimulated but slow to cry and with poor tone, so was taken to the warmer where NICU assessed them. Cord clamped after about 25 seconds and cut by FOB. Arterial and venous cord gases as well as cord blood was obtained. Placenta delivered spontaneously with gentle cord traction. Fundus firm with massage and Pitocin . Labia, perineum, vagina, and cervix inspected with 1st degree perineal and two 1st degree tears in the periuretheral and periclitoral area. The perineal laceration stopped bleeding with pressure and was minimal, no repair was done. The larger upper laceration which was periuretheral and extended somewhat inferiorly was repaired in two sections with 3-0 vicryl with good hemostasis.  Sponge, instrument and needle counts were correct x2.  Placenta: 3v intact, to L&D Complications: none Lacerations: 1st degree perineal, periuretheral, only the latter was repaired EBL: 100 cc Analgesia: epidural   Infant: Baby boy "Enzo"  APGAR (1 MIN): 5  APGAR (5 MINS): 7  APGAR (10 MINS): 9   Weight: 2710 grams  Arterial cord gas: H cord blood (arterial) 7.08 Low Panic   Comment: CRITICAL RESULT CALLED TO, READ BACK BY AND VERIFIED WITH:  L LIMA RN BY A MCGARTY RRT AT 1324 06/05/23  pCO2 cord blood (arterial) 56  Bicarbonate 16.6  Acid-base deficit 13.7 High     Venous cord gas: Ph Cord Blood (Venous) 7.19 Low Panic    Comment: CRITICAL RESULT CALLED TO, READ BACK BY AND VERIFIED WITH:  L LIMA RN BY A MCGARTY  RRT AT 1324 06/05/23  pCO2 Cord Blood (Venous) 43   Bicarbonate 16.4 16.6  Acid-base deficit 11.4 High       Teena Feast, MD/MPH Attending Family Medicine Physician, [facility], [facility]

## 2023-02-11 ENCOUNTER — Ambulatory Visit (INDEPENDENT_AMBULATORY_CARE_PROVIDER_SITE_OTHER): Payer: Medicaid Other | Admitting: Obstetrics & Gynecology

## 2023-02-11 VITALS — BP 118/74 | HR 82 | Wt 128.0 lb

## 2023-02-11 DIAGNOSIS — Z3492 Encounter for supervision of normal pregnancy, unspecified, second trimester: Secondary | ICD-10-CM

## 2023-02-11 DIAGNOSIS — Z3482 Encounter for supervision of other normal pregnancy, second trimester: Secondary | ICD-10-CM

## 2023-02-11 DIAGNOSIS — Z8759 Personal history of other complications of pregnancy, childbirth and the puerperium: Secondary | ICD-10-CM

## 2023-02-11 DIAGNOSIS — Z98891 History of uterine scar from previous surgery: Secondary | ICD-10-CM

## 2023-02-11 NOTE — Progress Notes (Signed)
   PRENATAL VISIT NOTE  Subjective:  Cheyenne Garcia is a 28 y.o. G2P1001 at [redacted]w[redacted]d being seen today for ongoing prenatal care.  She is currently monitored for the following issues for this high-risk pregnancy and has S/P cesarean section; Previous baby with fetal growth restriction; History of gestational hypertension; Atypical squamous cells of undetermined significance (ASCUS) on Papanicolaou smear of cervix; and Supervision of normal pregnancy on their problem list.  Patient reports no complaints.  Contractions: Not present. Vag. Bleeding: None.  Movement: Present. Denies leaking of fluid.   The following portions of the patient's history were reviewed and updated as appropriate: allergies, current medications, past family history, past medical history, past social history, past surgical history and problem list.   Objective:   Vitals:   02/11/23 0937  BP: 118/74  Pulse: 82  Weight: 128 lb (58.1 kg)    Fetal Status: Fetal Heart Rate (bpm): 154   Movement: Present     General:  Alert, oriented and cooperative. Patient is in no acute distress.  Skin: Skin is warm and dry. No rash noted.   Cardiovascular: Normal heart rate noted  Respiratory: Normal respiratory effort, no problems with respiration noted  Abdomen: Soft, gravid, appropriate for gestational age.  Pain/Pressure: Absent     Pelvic: Cervical exam deferred        Extremities: Normal range of motion.  Edema: None  Mental Status: Normal mood and affect. Normal behavior. Normal judgment and thought content.   Assessment and Plan:  Pregnancy: G2P1001 at [redacted]w[redacted]d 1. Encounter for supervision of normal pregnancy in second trimester, unspecified gravidity (Primary) Will start taking BP--will check Wednesday Jan 8th for first reading Would like Horizon and NIPS now  2. S/P cesarean section Elects for VAC--consent scanned to chart.   3. Previous baby with fetal growth restriction Growth scheduled.   4.  Hx of Gestational  HTN Cont. ASA  Preterm labor symptoms and general obstetric precautions including but not limited to vaginal bleeding, contractions, leaking of fluid and fetal movement were reviewed in detail with the patient. Please refer to After Visit Summary for other counseling recommendations.   No follow-ups on file.  Future Appointments  Date Time Provider Department Center  03/22/2023  3:30 PM [facility] US5 [facility]    Burnard Pate, MD

## 2023-02-19 DIAGNOSIS — Z3482 Encounter for supervision of other normal pregnancy, second trimester: Secondary | ICD-10-CM | POA: Diagnosis not present

## 2023-02-25 LAB — PANORAMA PRENATAL TEST FULL PANEL:PANORAMA TEST PLUS 5 ADDITIONAL MICRODELETIONS: FETAL FRACTION: 5.7

## 2023-02-26 ENCOUNTER — Encounter: Payer: Self-pay | Admitting: Obstetrics & Gynecology

## 2023-02-27 LAB — HORIZON CUSTOM: REPORT SUMMARY: NEGATIVE

## 2023-03-09 DIAGNOSIS — Z419 Encounter for procedure for purposes other than remedying health state, unspecified: Secondary | ICD-10-CM | POA: Diagnosis not present

## 2023-03-22 ENCOUNTER — Ambulatory Visit: Payer: Medicaid Other | Attending: Obstetrics and Gynecology

## 2023-03-22 ENCOUNTER — Other Ambulatory Visit: Payer: Self-pay | Admitting: *Deleted

## 2023-03-22 DIAGNOSIS — Z8759 Personal history of other complications of pregnancy, childbirth and the puerperium: Secondary | ICD-10-CM

## 2023-03-22 DIAGNOSIS — O365920 Maternal care for other known or suspected poor fetal growth, second trimester, not applicable or unspecified: Secondary | ICD-10-CM | POA: Diagnosis not present

## 2023-03-22 DIAGNOSIS — O09293 Supervision of pregnancy with other poor reproductive or obstetric history, third trimester: Secondary | ICD-10-CM

## 2023-03-22 DIAGNOSIS — Z3A28 28 weeks gestation of pregnancy: Secondary | ICD-10-CM

## 2023-03-22 DIAGNOSIS — O365930 Maternal care for other known or suspected poor fetal growth, third trimester, not applicable or unspecified: Secondary | ICD-10-CM | POA: Diagnosis not present

## 2023-03-23 NOTE — Progress Notes (Unsigned)
PRENATAL VISIT NOTE  Subjective:  Cheyenne Garcia is a 28 y.o. G2P1001 at [redacted]w[redacted]d being seen today for ongoing prenatal care.  She is currently monitored for the following issues for this low-risk pregnancy and has S/P cesarean section; Previous baby with fetal growth restriction; History of gestational hypertension; Atypical squamous cells of undetermined significance (ASCUS) on Papanicolaou smear of cervix; and Supervision of normal pregnancy on their problem list.  Patient reports {[id number]}.   .  .   . Denies leaking of fluid.   The following portions of the patient's history were reviewed and updated as appropriate: allergies, current medications, past family history, past medical history, past social history, past surgical history and problem list.   Objective:  There were no vitals filed for this visit.  Fetal Status:           General:  Alert, oriented and cooperative. Patient is in no acute distress.  Skin: Skin is warm and dry. No rash noted.   Cardiovascular: Normal heart rate noted  Respiratory: Normal respiratory effort, no problems with respiration noted  Abdomen: Soft, gravid, appropriate for gestational age.        Pelvic: Cervical exam deferred        Extremities: Normal range of motion.     Mental Status: Normal mood and affect. Normal behavior. Normal judgment and thought content.   Assessment and Plan:  Pregnancy: G2P1001 at [redacted]w[redacted]d 1. S/P cesarean section (Primary) - We discussed her history of c-section. Her previous c-section was due to  non-reassuring fetal heart tones (prolonged decels - dilated to 4.5 cm) in the setting of GHTN and FGR with elevated UADs.  She has a history of  no prior successful vaginal deliveries - We discussed the risks associated with repeat c-section: bleeding, infection, injury to surrounding organs/tissues I.e. bowel/bladder, development of scar tissue, wound complications such as wound separation or infection, need for additional surgery,  percreta/acreta - We discussed the risks associated with TOLAC: risk of it being unsuccessful, specially in the context of her history, the risks in general of a vaginal delivery (prolapse, SUI, differences in recovery, pelvic floor dysfunction, etc), and the risk of uterine rupture. We discussed with the risk of uterine rupture that while rare it is not easily predicted, that it is a surgical emergency, and it can be potentially catastrophic for mom and baby. We discussed if uterine rupture that it may necessitate hysterectomy if the rupture caused issues with bleeding that could not be managed with other surgical options.  - After counseling, the patient was given the opportunity to ask questions and all questions answered.  - After considering her options, she would like {Blank single:[id number]::"to TOLAC","to have a repeat c-section","take some time to consider the options"} - Information provided to the patient   2. Encounter for supervision of normal pregnancy in second trimester, unspecified gravidity Recommended tdap - pt *** Offered flu shot - pt *** Discussed MOC: ***  3. Previous baby with fetal growth restriction Growth on 12/14 was 19%ile and normal AC. MFM recommends next [place] around 32-33 weeks.   4. History of gestational hypertension BP today ***  5. Atypical squamous cells of undetermined significance (ASCUS) on Papanicolaou smear of cervix Needs pap at [facility] visit with HPV  Preterm labor symptoms and general obstetric precautions including but not limited to vaginal bleeding, contractions, leaking of fluid and fetal movement were reviewed in detail with the patient. Please refer to After Visit Summary for other counseling recommendations.   No  follow-ups on file.  Future Appointments  Date Time Provider Department Center  03/25/2023  8:30 AM Milas Hock, MD [facility]  04/26/2023  3:30 PM [facility] US4 [facility]    Milas Hock, MD

## 2023-03-25 ENCOUNTER — Ambulatory Visit (INDEPENDENT_AMBULATORY_CARE_PROVIDER_SITE_OTHER): Payer: Medicaid Other | Admitting: Obstetrics and Gynecology

## 2023-03-25 VITALS — BP 122/79 | HR 92 | Wt 133.0 lb

## 2023-03-25 DIAGNOSIS — R87610 Atypical squamous cells of undetermined significance on cytologic smear of cervix (ASC-US): Secondary | ICD-10-CM | POA: Diagnosis not present

## 2023-03-25 DIAGNOSIS — Z98891 History of uterine scar from previous surgery: Secondary | ICD-10-CM | POA: Diagnosis not present

## 2023-03-25 DIAGNOSIS — F419 Anxiety disorder, unspecified: Secondary | ICD-10-CM | POA: Diagnosis not present

## 2023-03-25 DIAGNOSIS — Z3A28 28 weeks gestation of pregnancy: Secondary | ICD-10-CM | POA: Diagnosis not present

## 2023-03-25 DIAGNOSIS — Z3483 Encounter for supervision of other normal pregnancy, third trimester: Secondary | ICD-10-CM | POA: Diagnosis not present

## 2023-03-25 DIAGNOSIS — Z8759 Personal history of other complications of pregnancy, childbirth and the puerperium: Secondary | ICD-10-CM

## 2023-03-25 DIAGNOSIS — Z3492 Encounter for supervision of normal pregnancy, unspecified, second trimester: Secondary | ICD-10-CM

## 2023-03-25 NOTE — Progress Notes (Signed)
Pt will get Tdap at next visit  Pt has noticed increase in anxiety

## 2023-03-26 LAB — CBC
Hematocrit: 33 % — ABNORMAL LOW (ref 34.0–46.6)
Hemoglobin: 10.8 g/dL — ABNORMAL LOW (ref 11.1–15.9)
MCH: 29.3 pg (ref 26.6–33.0)
MCHC: 32.7 g/dL (ref 31.5–35.7)
MCV: 90 fL (ref 79–97)
Platelets: 261 10*3/uL (ref 150–450)
RBC: 3.68 x10E6/uL — ABNORMAL LOW (ref 3.77–5.28)
RDW: 13 % (ref 11.7–15.4)
WBC: 7.7 10*3/uL (ref 3.4–10.8)

## 2023-03-26 LAB — GLUCOSE TOLERANCE, 2 HOURS W/ 1HR
Glucose, 1 hour: 85 mg/dL (ref 70–179)
Glucose, 2 hour: 97 mg/dL (ref 70–152)
Glucose, Fasting: 90 mg/dL (ref 70–91)

## 2023-03-26 LAB — HIV ANTIBODY (ROUTINE TESTING W REFLEX): HIV Screen 4th Generation wRfx: NONREACTIVE

## 2023-03-26 LAB — RPR: RPR Ser Ql: NONREACTIVE

## 2023-03-28 ENCOUNTER — Encounter: Payer: Self-pay | Admitting: Obstetrics and Gynecology

## 2023-04-06 DIAGNOSIS — Z419 Encounter for procedure for purposes other than remedying health state, unspecified: Secondary | ICD-10-CM | POA: Diagnosis not present

## 2023-04-23 ENCOUNTER — Ambulatory Visit (INDEPENDENT_AMBULATORY_CARE_PROVIDER_SITE_OTHER): Payer: Medicaid Other | Admitting: Obstetrics and Gynecology

## 2023-04-23 VITALS — BP 119/81 | HR 83 | Wt 148.0 lb

## 2023-04-23 DIAGNOSIS — Z3492 Encounter for supervision of normal pregnancy, unspecified, second trimester: Secondary | ICD-10-CM | POA: Diagnosis not present

## 2023-04-23 NOTE — Progress Notes (Signed)
   PRENATAL VISIT NOTE  Subjective:  Cheyenne Garcia is a 28 y.o. G2P1001 at [redacted]w[redacted]d being seen today for ongoing prenatal care.  She is currently monitored for the following issues for this low-risk pregnancy and has S/P cesarean section; Previous baby with fetal growth restriction; History of gestational hypertension; Atypical squamous cells of undetermined significance (ASCUS) on Papanicolaou smear of cervix; Supervision of normal pregnancy; and Anxiety on their problem list.  Patient reports no complaints.  Contractions: Not present. Vag. Bleeding: None.  Movement: Present. Denies leaking of fluid.   The following portions of the patient's history were reviewed and updated as appropriate: allergies, current medications, past family history, past medical history, past social history, past surgical history and problem list.   Objective:   Vitals:   04/23/23 0843  BP: 119/81  Pulse: 83  Weight: 148 lb (67.1 kg)    Fetal Status: Fetal Heart Rate (bpm): 144   Movement: Present     General:  Alert, oriented and cooperative. Patient is in no acute distress.  Skin: Skin is warm and dry. No rash noted.   Cardiovascular: Normal heart rate noted  Respiratory: Normal respiratory effort, no problems with respiration noted  Abdomen: Soft, gravid, appropriate for gestational age.  Pain/Pressure: Absent     Pelvic: Cervical exam deferred        Extremities: Normal range of motion.  Edema: None  Mental Status: Normal mood and affect. Normal behavior. Normal judgment and thought content.   Assessment and Plan:  Pregnancy: G2P1001 at [redacted]w[redacted]d   1. Encounter for supervision of normal pregnancy in second trimester, unspecified gravidity (Primary)  BP good Continue BASA Growth [place] scheduled In a few day.    Preterm labor symptoms and general obstetric precautions including but not limited to vaginal bleeding, contractions, leaking of fluid and fetal movement were reviewed in detail with the  patient. Please refer to After Visit Summary for other counseling recommendations.   No follow-ups on file.  Future Appointments  Date Time Provider Department Center  04/26/2023  3:30 PM [facility] US4 [facility]    Venia Carbon, NP

## 2023-04-23 NOTE — Progress Notes (Signed)
 Pt states right had has been going numb at times

## 2023-04-25 DIAGNOSIS — R21 Rash and other nonspecific skin eruption: Secondary | ICD-10-CM | POA: Diagnosis not present

## 2023-04-25 DIAGNOSIS — R87610 Atypical squamous cells of undetermined significance on cytologic smear of cervix (ASC-US): Secondary | ICD-10-CM | POA: Diagnosis not present

## 2023-04-26 ENCOUNTER — Ambulatory Visit: Payer: Medicaid Other | Attending: Obstetrics

## 2023-04-26 DIAGNOSIS — Z8759 Personal history of other complications of pregnancy, childbirth and the puerperium: Secondary | ICD-10-CM | POA: Diagnosis not present

## 2023-04-26 DIAGNOSIS — Z3A33 33 weeks gestation of pregnancy: Secondary | ICD-10-CM

## 2023-04-26 DIAGNOSIS — O09293 Supervision of pregnancy with other poor reproductive or obstetric history, third trimester: Secondary | ICD-10-CM | POA: Diagnosis not present

## 2023-05-18 DIAGNOSIS — Z419 Encounter for procedure for purposes other than remedying health state, unspecified: Secondary | ICD-10-CM | POA: Diagnosis not present

## 2023-05-20 ENCOUNTER — Other Ambulatory Visit (HOSPITAL_COMMUNITY)
Admission: RE | Admit: 2023-05-20 | Discharge: 2023-05-20 | Disposition: A | Discharge status: Home / Self Care | Source: Ambulatory Visit | Attending: Obstetrics and Gynecology | Admitting: Obstetrics and Gynecology

## 2023-05-20 ENCOUNTER — Ambulatory Visit (INDEPENDENT_AMBULATORY_CARE_PROVIDER_SITE_OTHER): Admitting: Obstetrics and Gynecology

## 2023-05-20 VITALS — BP 131/87 | HR 102 | Wt 157.0 lb

## 2023-05-20 DIAGNOSIS — Z3A36 36 weeks gestation of pregnancy: Secondary | ICD-10-CM

## 2023-05-20 DIAGNOSIS — Z3483 Encounter for supervision of other normal pregnancy, third trimester: Secondary | ICD-10-CM

## 2023-05-20 DIAGNOSIS — Z23 Encounter for immunization: Secondary | ICD-10-CM

## 2023-05-20 DIAGNOSIS — Z98891 History of uterine scar from previous surgery: Secondary | ICD-10-CM | POA: Diagnosis not present

## 2023-05-20 DIAGNOSIS — Z8759 Personal history of other complications of pregnancy, childbirth and the puerperium: Secondary | ICD-10-CM | POA: Diagnosis not present

## 2023-05-20 DIAGNOSIS — F419 Anxiety disorder, unspecified: Secondary | ICD-10-CM | POA: Diagnosis not present

## 2023-05-20 DIAGNOSIS — R030 Elevated blood-pressure reading, without diagnosis of hypertension: Secondary | ICD-10-CM

## 2023-05-20 NOTE — Addendum Note (Signed)
 Addended by: Daiva Duane on: 05/20/2023 09:39 AM   Modules accepted: Orders

## 2023-05-20 NOTE — Progress Notes (Signed)
   PRENATAL VISIT NOTE  Subjective:  Cheyenne Garcia is a 28 y.o. G2P1001 at [redacted]w[redacted]d being seen today for ongoing prenatal care.  She is currently monitored for the following issues for this low-risk pregnancy and has S/P cesarean section; Previous baby with fetal growth restriction; History of gestational hypertension; Atypical squamous cells of undetermined significance (ASCUS) on Papanicolaou smear of cervix; Supervision of normal pregnancy; and Anxiety on their problem list.  Patient reports no complaints.  Contractions: Not present. Vag. Bleeding: None.  Movement: Present. Denies leaking of fluid.   The following portions of the patient's history were reviewed and updated as appropriate: allergies, current medications, past family history, past medical history, past social history, past surgical history and problem list.   Objective:   Vitals:   05/20/23 0910 05/20/23 0931  BP: (!) 156/98 131/87  Pulse: (!) 102   Weight: 157 lb (71.2 kg)     Fetal Status: Fetal Heart Rate (bpm): 160 Fundal Height: 34 cm Movement: Present  Presentation: Vertex  General:  Alert, oriented and cooperative. Patient is in no acute distress.  Skin: Skin is warm and dry. No rash noted.   Cardiovascular: Normal heart rate noted  Respiratory: Normal respiratory effort, no problems with respiration noted  Abdomen: Soft, gravid, appropriate for gestational age.  Pain/Pressure: Present      Assessment and Plan:  Pregnancy: G2P1001 at [redacted]w[redacted]d 1. Encounter for supervision of other normal pregnancy in third trimester (Primary) 2. [redacted] weeks gestation of pregnancy GBS/GC/CT collected Cephalic by US  Tdap today  3. S/P cesarean section G1 NRFHT at 4.5cm, infant weight 2410g For TOLAC, consent previously signed  4. History of gestational hypertension 5. Previous baby with fetal growth restriction ldASA @33 /0 1984g (26%), AC 48%; no further growths scheduled  6. Anxiety Reports mood has been good, no concerns  today  7. Elevated blood pressure reading Asymptomatic. Recheck normotensive after patient rested (was rushing into appt as she was running late) Has been checking BP at home & normotensive Discussed si/sx preE, home BP monitoring and to notify for Bps of 140+/90+ Discussed follow up in 1 week so we can monitor BP closer  Please refer to After Visit Summary for other counseling recommendations.   Return in about 1 week (around 05/27/2023) for return OB at 37 weeks.  Future Appointments  Date Time Provider Department Center  06/04/2023  8:50 AM Rasch, Juliette Oh, NP [facility]  06/11/2023  9:10 AM Rasch, Juliette Oh, NP [facility]   Izell Marsh, MD

## 2023-05-21 LAB — CERVICOVAGINAL ANCILLARY ONLY
Chlamydia: NEGATIVE
Comment: NEGATIVE
Comment: NORMAL
Neisseria Gonorrhea: NEGATIVE

## 2023-05-24 LAB — SPECIMEN STATUS REPORT

## 2023-05-24 LAB — CULTURE, BETA STREP (GROUP B ONLY): Strep Gp B Culture: NEGATIVE

## 2023-05-25 ENCOUNTER — Encounter: Payer: Self-pay | Admitting: Obstetrics and Gynecology

## 2023-05-29 ENCOUNTER — Ambulatory Visit (INDEPENDENT_AMBULATORY_CARE_PROVIDER_SITE_OTHER): Admitting: Obstetrics and Gynecology

## 2023-05-29 VITALS — BP 127/86 | HR 77 | Wt 160.0 lb

## 2023-05-29 DIAGNOSIS — Z3483 Encounter for supervision of other normal pregnancy, third trimester: Secondary | ICD-10-CM | POA: Diagnosis not present

## 2023-05-29 NOTE — Progress Notes (Signed)
   PRENATAL VISIT NOTE  Subjective:  Cheyenne Garcia is a 28 y.o. G2P1001 at [redacted]w[redacted]d being seen today for ongoing prenatal care.  She is currently monitored for the following issues for this low-risk pregnancy and has S/P cesarean section; Previous baby with fetal growth restriction; History of gestational hypertension; Atypical squamous cells of undetermined significance (ASCUS) on Papanicolaou smear of cervix; Supervision of normal pregnancy; and Anxiety on their problem list.  Patient reports baby is not moving as much as usual this morning. Hasn't eaten yet. No preE symptoms. Contractions: Not present. Vag. Bleeding: None.  Movement: Present. Denies leaking of fluid.   The following portions of the patient's history were reviewed and updated as appropriate: allergies, current medications, past family history, past medical history, past social history, past surgical history and problem list.   Objective:   Vitals:   05/29/23 0857  BP: 127/86  Pulse: 77  Weight: 160 lb (72.6 kg)    Fetal Status: Fetal Heart Rate (bpm): 136   Movement: Present     General:  Alert, oriented and cooperative. Patient is in no acute distress.  Skin: Skin is warm and dry. No rash noted.   Cardiovascular: Normal heart rate noted  Respiratory: Normal respiratory effort, no problems with respiration noted  Abdomen: Soft, gravid, appropriate for gestational age.  Pain/Pressure: Absent      Assessment and Plan:  Pregnancy: G2P1001 at [redacted]w[redacted]d 1. Encounter for supervision of other normal pregnancy in third trimester (Primary) 2. [redacted] weeks gestation of pregnancy GBS & GC/CT negative NST today reactive and reassuring - 130bpm, moderate variability, accels present, no decels. Discussed kick counts & strict precautions for DFM   3. S/P cesarean section G1 NRFHT at 4.5cm, infant weight 2410g For TOLAC, consent previously signed  4. History of gestational hypertension 5. Previous baby with fetal growth  restriction ldASA @33 /0 1984g (26%), AC 48%; no further growths scheduled  6. Anxiety Reports mood has been good, no concerns today  7. Elevated blood pressure reading Has elevated BP last week with normal recheck. Here today for BP check. Normotensive and asymptomatic.  Son was on spring break last week so she did not take home BP RTC in 1 week as previously scheduled  Please refer to After Visit Summary for other counseling recommendations.   Return in about 1 week (around 06/05/2023) for return OB at 38 weeks.  Future Appointments  Date Time Provider Department Center  06/04/2023  8:50 AM Rasch, Juliette Oh, NP [facility]  06/11/2023  9:10 AM Rasch, Juliette Oh, NP [facility]   Izell Marsh, MD

## 2023-05-29 NOTE — Progress Notes (Signed)
 States baby less active today than usual.

## 2023-06-02 ENCOUNTER — Encounter: Payer: Self-pay | Admitting: Obstetrics and Gynecology

## 2023-06-04 ENCOUNTER — Inpatient Hospital Stay (HOSPITAL_COMMUNITY)
Admission: AD | Admit: 2023-06-04 | Discharge: 2023-06-07 | DRG: 807 | Disposition: A | Discharge status: Home / Self Care | Attending: Family Medicine | Admitting: Family Medicine

## 2023-06-04 ENCOUNTER — Other Ambulatory Visit: Payer: Self-pay

## 2023-06-04 ENCOUNTER — Encounter (HOSPITAL_COMMUNITY): Payer: Self-pay | Admitting: Obstetrics and Gynecology

## 2023-06-04 ENCOUNTER — Ambulatory Visit (INDEPENDENT_AMBULATORY_CARE_PROVIDER_SITE_OTHER): Admitting: Obstetrics and Gynecology

## 2023-06-04 VITALS — BP 140/99 | HR 86 | Wt 161.0 lb

## 2023-06-04 DIAGNOSIS — R87610 Atypical squamous cells of undetermined significance on cytologic smear of cervix (ASC-US): Secondary | ICD-10-CM | POA: Diagnosis present

## 2023-06-04 DIAGNOSIS — Z79899 Other long term (current) drug therapy: Secondary | ICD-10-CM

## 2023-06-04 DIAGNOSIS — O99344 Other mental disorders complicating childbirth: Secondary | ICD-10-CM | POA: Diagnosis not present

## 2023-06-04 DIAGNOSIS — O139 Gestational [pregnancy-induced] hypertension without significant proteinuria, unspecified trimester: Principal | ICD-10-CM | POA: Diagnosis present

## 2023-06-04 DIAGNOSIS — O34219 Maternal care for unspecified type scar from previous cesarean delivery: Secondary | ICD-10-CM | POA: Diagnosis present

## 2023-06-04 DIAGNOSIS — Z3490 Encounter for supervision of normal pregnancy, unspecified, unspecified trimester: Secondary | ICD-10-CM

## 2023-06-04 DIAGNOSIS — O134 Gestational [pregnancy-induced] hypertension without significant proteinuria, complicating childbirth: Secondary | ICD-10-CM | POA: Diagnosis not present

## 2023-06-04 DIAGNOSIS — Z8249 Family history of ischemic heart disease and other diseases of the circulatory system: Secondary | ICD-10-CM | POA: Diagnosis not present

## 2023-06-04 DIAGNOSIS — O133 Gestational [pregnancy-induced] hypertension without significant proteinuria, third trimester: Secondary | ICD-10-CM

## 2023-06-04 DIAGNOSIS — Z3A38 38 weeks gestation of pregnancy: Secondary | ICD-10-CM

## 2023-06-04 DIAGNOSIS — F419 Anxiety disorder, unspecified: Secondary | ICD-10-CM | POA: Diagnosis present

## 2023-06-04 DIAGNOSIS — Z7982 Long term (current) use of aspirin: Secondary | ICD-10-CM | POA: Diagnosis not present

## 2023-06-04 DIAGNOSIS — Z98891 History of uterine scar from previous surgery: Secondary | ICD-10-CM

## 2023-06-04 DIAGNOSIS — Z8759 Personal history of other complications of pregnancy, childbirth and the puerperium: Secondary | ICD-10-CM

## 2023-06-04 DIAGNOSIS — Z3483 Encounter for supervision of other normal pregnancy, third trimester: Secondary | ICD-10-CM

## 2023-06-04 DIAGNOSIS — O34211 Maternal care for low transverse scar from previous cesarean delivery: Secondary | ICD-10-CM | POA: Diagnosis not present

## 2023-06-04 LAB — COMPREHENSIVE METABOLIC PANEL WITH GFR
ALT: 14 U/L (ref 0–44)
AST: 25 U/L (ref 15–41)
Albumin: 2.8 g/dL — ABNORMAL LOW (ref 3.5–5.0)
Alkaline Phosphatase: 228 U/L — ABNORMAL HIGH (ref 38–126)
Anion gap: 11 (ref 5–15)
BUN: 11 mg/dL (ref 6–20)
CO2: 19 mmol/L — ABNORMAL LOW (ref 22–32)
Calcium: 8.8 mg/dL — ABNORMAL LOW (ref 8.9–10.3)
Chloride: 105 mmol/L (ref 98–111)
Creatinine, Ser: 0.64 mg/dL (ref 0.44–1.00)
GFR, Estimated: >60 mL/min (ref >60)
Glucose, Bld: 85 mg/dL (ref 70–99)
Potassium: 3.9 mmol/L (ref 3.5–5.1)
Sodium: 135 mmol/L (ref 135–145)
Total Bilirubin: 0.5 mg/dL (ref 0.0–1.2)
Total Protein: 6.5 g/dL (ref 6.5–8.1)

## 2023-06-04 LAB — TYPE AND SCREEN
ABO/RH(D): O POS
Antibody Screen: NEGATIVE

## 2023-06-04 LAB — CBC
HCT: 37 % (ref 36.0–46.0)
Hemoglobin: 12.5 g/dL (ref 12.0–15.0)
MCH: 28.9 pg (ref 26.0–34.0)
MCHC: 33.8 g/dL (ref 30.0–36.0)
MCV: 85.6 fL (ref 80.0–100.0)
Platelets: 288 10*3/uL (ref 150–400)
RBC: 4.32 MIL/uL (ref 3.87–5.11)
RDW: 14.4 % (ref 11.5–15.5)
WBC: 7.9 10*3/uL (ref 4.0–10.5)
nRBC: 0 % (ref 0.0–0.2)

## 2023-06-04 MED ORDER — SODIUM CHLORIDE 0.9% FLUSH: 3.0000 mL | Freq: Two times a day (BID) | INTRAVENOUS | Status: DC

## 2023-06-04 MED ORDER — FENTANYL CITRATE (PF) 100 MCG/2ML IJ SOLN
INTRAMUSCULAR | Status: AC
  Administered 2023-06-05: 100 ug
  Filled 2023-06-04: qty 2

## 2023-06-04 MED ORDER — OXYTOCIN-SODIUM CHLORIDE 30-0.9 UT/500ML-% IV SOLN: 2.5000 [IU]/h | INTRAVENOUS | Status: DC

## 2023-06-04 MED ORDER — SOD CITRATE-CITRIC ACID 500-334 MG/5ML PO SOLN: 30.0000 mL | ORAL | Status: DC | PRN

## 2023-06-04 MED ORDER — ACETAMINOPHEN 325 MG PO TABS: 650.0000 mg | ORAL_TABLET | ORAL | Status: DC | PRN

## 2023-06-04 MED ORDER — TERBUTALINE SULFATE 1 MG/ML IJ SOLN: 0.2500 mg | Freq: Once | INTRAMUSCULAR | Status: DC | PRN

## 2023-06-04 MED ORDER — OXYTOCIN-SODIUM CHLORIDE 30-0.9 UT/500ML-% IV SOLN
1.0000 m[IU]/min | INTRAVENOUS | Status: DC
  Administered 2023-06-04: 2 m[IU]/min via INTRAVENOUS
  Filled 2023-06-04: qty 500

## 2023-06-04 MED ORDER — OXYCODONE-ACETAMINOPHEN 5-325 MG PO TABS: 1.0000 | ORAL_TABLET | ORAL | Status: DC | PRN

## 2023-06-04 MED ORDER — OXYTOCIN BOLUS FROM INFUSION
333.0000 mL | Freq: Once | INTRAVENOUS | Status: AC
  Administered 2023-06-05: 333 mL via INTRAVENOUS

## 2023-06-04 MED ORDER — LACTATED RINGERS IV SOLN: INTRAVENOUS | Status: AC

## 2023-06-04 MED ORDER — OXYCODONE-ACETAMINOPHEN 5-325 MG PO TABS: 2.0000 | ORAL_TABLET | ORAL | Status: DC | PRN

## 2023-06-04 MED ORDER — ONDANSETRON HCL 4 MG/2ML IJ SOLN
4.0000 mg | Freq: Four times a day (QID) | INTRAMUSCULAR | Status: DC | PRN
  Administered 2023-06-05: 4 mg via INTRAVENOUS
  Filled 2023-06-04: qty 2

## 2023-06-04 MED ORDER — SODIUM CHLORIDE 0.9% FLUSH: 3.0000 mL | INTRAVENOUS | Status: DC | PRN

## 2023-06-04 MED ORDER — LIDOCAINE HCL (PF) 1 % IJ SOLN: 30.0000 mL | INTRAMUSCULAR | Status: DC | PRN

## 2023-06-04 MED ORDER — FENTANYL CITRATE (PF) 100 MCG/2ML IJ SOLN
50.0000 ug | INTRAMUSCULAR | Status: DC | PRN
  Administered 2023-06-04: 50 ug via INTRAVENOUS
  Administered 2023-06-05: 100 ug via INTRAVENOUS
  Filled 2023-06-04 (×2): qty 2

## 2023-06-04 NOTE — H&P (Addendum)
 LABOR AND DELIVERY ADMISSION HISTORY AND PHYSICAL NOTE  Cheyenne Garcia is a 28 y.o. female [phone] with IUP at [redacted]w[redacted]d presenting for IOL 2/2 new gHTN.   Patient reports the fetal movement as active. Patient reports uterine contraction activity as rare. Patient reports  vaginal bleeding as none. Patient describes fluid per vagina as None.   Patient denies headache, vision changes, chest pain, shortness of breath, right upper quadrant pain, or LE edema.  She plans on breast feeding. Her contraception plan is: undecided.  Prenatal History/Complications: PNC at [facility] - Hx IUGR with prior pregnancy - S/p C/S for NRFHT  Sono:  @[redacted]w[redacted]d , CWD, normal anatomy, cephalic presentation, fundal placenta, 26%ile  Pregnancy complications:  Patient Active Problem List   Diagnosis Date Noted   Gestational hypertension 06/04/2023   Anxiety 03/25/2023   Supervision of normal pregnancy 11/14/2022   Atypical squamous cells of undetermined significance (ASCUS) on Papanicolaou smear of cervix 08/28/2021   History of gestational hypertension 12/07/2016   Previous baby with fetal growth restriction 11/30/2016   S/P cesarean section 07/09/2016    Past Medical History: Past Medical History:  Diagnosis Date   Asthma    Chlamydia    History of gestational hypertension 12/07/2016   36 weeks 133/93 [ ] Labs drawn   S/P cesarean section 07/09/2016      Clinic  [facility]  Prenatal Labs  Dating   LMP  Blood type: O/POS/-- (06/04 1147)   Genetic Screen  1 Screen:    AFP:     Quad:     NIPS:  Antibody:NEG (06/04 1147)  Anatomic US   nml except UTD f/u US > resolved; 32 wks> EFW 19th %tile; AC 7th %tile >wkly BPP and UA doppler    Rubella: 1.64 (06/04 1147)  GTT  Early:               Third trimester: nml  RPR: NON REAC (06/04 1147)   Flu vaccine   de    Past Surgical History: Past Surgical History:  Procedure Laterality Date   CESAREAN SECTION N/A 12/22/2016   Procedure: CESAREAN SECTION;  Surgeon: Julianne Octave,  MD;  Location: [facility];  Service: Obstetrics;  Laterality: N/A;   NO PAST SURGERIES      Obstetrical History: OB History     Gravida  2   Para  2   Term  2   Preterm      AB      Living  2      SAB      IAB      Ectopic      Multiple  0   Live Births  2           Social History: Social History   Socioeconomic History   Marital status: Single    Spouse name: Not on file   Number of children: Not on file   Years of education: Not on file   Highest education level: Not on file  Occupational History   Not on file  Tobacco Use   Smoking status: Never   Smokeless tobacco: Never  Vaping Use   Vaping status: Never Used  Substance and Sexual Activity   Alcohol use: No   Drug use: No   Sexual activity: Yes  Other Topics Concern   Not on file  Social History Narrative   Not on file   Social Drivers of Health   Financial Resource Strain: Patient Declined (04/24/2023)   Received from [facility]  Overall Financial Resource Strain (CARDIA)    Difficulty of Paying Living Expenses: Patient declined  Food Insecurity: No Food Insecurity (06/04/2023)   Hunger Vital Sign    Worried About Running Out of Food in the Last Year: Never true    Ran Out of Food in the Last Year: Never true  Transportation Needs: No Transportation Needs (06/04/2023)   PRAPARE - Administrator, Civil Service (Medical): No    Lack of Transportation (Non-Medical): No  Physical Activity: Unknown (04/24/2023)   Received from [facility]   Exercise Vital Sign    Days of Exercise per Week: Patient declined    Minutes of Exercise per Session: Not on file  Stress: No Stress Concern Present (04/24/2023)   Received from [facility] - Occupational Stress Questionnaire    Feeling of Stress : Not at all  Social Connections: Patient Declined (04/24/2023)   Received from [facility]   Social Network    How would you rate  your social network (family, work, friends)?: Patient declined    Family History: Family History  Problem Relation Age of Onset   Hypertension Father    Hypertension Brother    Heart disease Neg Hx    Diabetes Neg Hx    Cancer Neg Hx    Asthma Neg Hx     Allergies: No Known Allergies  No medications prior to admission.     Review of Systems  All systems reviewed and negative except as stated in HPI  Physical Exam BP 127/84 (BP Location: Right Arm)   Pulse 91   Temp 98 F (36.7 C) (Oral)   Resp 18   Ht 5' (1.524 m)   Wt 73.6 kg   LMP 09/07/2022   SpO2 100%   Breastfeeding Unknown   BMI 31.70 kg/m   Physical Exam Constitutional:      General: She is not in acute distress.    Appearance: She is not ill-appearing.  Cardiovascular:     Rate and Rhythm: Normal rate.  Pulmonary:     Effort: Pulmonary effort is normal.  Abdominal:     Comments: Gravid  Musculoskeletal:        General: No swelling.  Skin:    General: Skin is warm and dry.  Neurological:     General: No focal deficit present.  Psychiatric:        Mood and Affect: Mood normal.   Presentation: cephalic by BSUS  Fetal monitoring: Baseline: 135 bpm, Variability: Good {> 6 bpm), Accelerations: Reactive, and Decelerations: Absent Uterine activity: rare  Dilation: 10 Dilation Complete Date: 06/05/23 Dilation Complete Time: 0920 Effacement (%): 100 Station: 0 Presentation: Vertex Exam by:: Lima, rn  Prenatal labs: ABO, Rh: --/--/O POS (04/29 1341) Antibody: NEG (04/29 1341) Rubella: 1.53 (10/11 1011) RPR: NON REACTIVE (04/29 1347)  HBsAg: Negative (10/11 1011)  HIV: Non Reactive (02/17 0917)  GC/Chlamydia:  Neisseria Gonorrhea  Date Value Ref Range Status  05/20/2023 Negative  Final   Chlamydia  Date Value Ref Range Status  05/20/2023 Negative  Final   GBS: Negative/-- (04/14 0000)   Prenatal Transfer Tool  Maternal Diabetes: No Genetic Screening: Normal Maternal  Ultrasounds/Referrals: Normal Fetal Ultrasounds or other Referrals:  None Maternal Substance Abuse:  No Significant Maternal Medications:  None Significant Maternal Lab Results: Group B Strep negative  No results found for this or any previous visit (from the past 24 hours).   Assessment: Cheyenne Garcia is a  28 y.o. [id number] at [redacted]w[redacted]d here for IOL 2/2 gHTN.  #Labor: TOLAC. Amenable to FB + low-dose pit for cervical ripening. Discussed role for AROM. Discussed R/B/A of FB placement, and verbal consent obtained. Fentanyl  pre-procedure. Placed Cook without difficulty and balloon filled with 60 cc of saline. Mom and babe tolerated well. #Pain: IV pain meds PRN, epidural upon request #FHT: Category I #GBS/ID: Negative #MOF: breast feeding #MOC: unsure #Circ: No  #gHTN: CMP, P:C pending. No s/sx of preE at this time. Lasix /K PP  Maud Sorenson, MD [facility] Fellow Center for [facility]  06/13/2023, 2:07 PM

## 2023-06-04 NOTE — Progress Notes (Addendum)
   PRENATAL VISIT NOTE  Subjective:  Cheyenne Garcia is a 28 y.o. G2P1001 at [redacted]w[redacted]d being seen today for ongoing prenatal care.  She is currently monitored for the following issues for this low-risk pregnancy and has S/P cesarean section; Previous baby with fetal growth restriction; History of gestational hypertension; Atypical squamous cells of undetermined significance (ASCUS) on Papanicolaou smear of cervix; Supervision of normal pregnancy; and Anxiety on their problem list.  Patient reports no complaints.  Contractions: Not present. Vag. Bleeding: None.  Movement: Present. Denies leaking of fluid.   The following portions of the patient's history were reviewed and updated as appropriate: allergies, current medications, past family history, past medical history, past social history, past surgical history and problem list.   Objective:   Vitals:   06/04/23 0858 06/04/23 0902  BP: (!) 138/93 (!) 140/99  Pulse: 86   Weight: 161 lb (73 kg)     Fetal Status: Fetal Heart Rate (bpm): 142 Fundal Height: 38 cm Movement: Present     General:  Alert, oriented and cooperative. Patient is in no acute distress.  Skin: Skin is warm and dry. No rash noted.   Cardiovascular: Normal heart rate noted  Respiratory: Normal respiratory effort, no problems with respiration noted  Abdomen: Soft, gravid, appropriate for gestational age.  Pain/Pressure: Absent     Pelvic: Cervical exam deferred        Extremities: Normal range of motion.  Edema: Trace  Mental Status: Normal mood and affect. Normal behavior. Normal judgment and thought content.   Assessment and Plan:  Pregnancy: G2P1001 at [redacted]w[redacted]d  1. Gestational hypertension, third trimester (Primary)  - BP elevated x 2 today. - First elevated BP @ 36w 156/98 with repeat 131/87 - Denies HA, vision changes or abdominal pain - reports leg swell - Reports vigorous fetal movements.  - CBC with Differential/Platelet - Comp Met (CMET) - RPR - Type and  screen; Future - Protein / creatinine ratio, urine  2. Encounter for supervision of other normal pregnancy in third trimester -Planning induction today; no medical inductions available, will send to labor for direct admit. Labor team acknowledged admission.  - No labs collected due to direct admit  Term labor symptoms and general obstetric precautions including but not limited to vaginal bleeding, contractions, leaking of fluid and fetal movement were reviewed in detail with the patient. Please refer to After Visit Summary for other counseling recommendations.   No follow-ups on file.  Future Appointments  Date Time Provider Department Center  06/11/2023  9:10 AM Leodan Bolyard, Juliette Oh, NP [facility]    Almond Jaffe, NP

## 2023-06-05 ENCOUNTER — Inpatient Hospital Stay (HOSPITAL_COMMUNITY): Admitting: Anesthesiology

## 2023-06-05 ENCOUNTER — Encounter (HOSPITAL_COMMUNITY): Payer: Self-pay | Admitting: Obstetrics and Gynecology

## 2023-06-05 DIAGNOSIS — O34211 Maternal care for low transverse scar from previous cesarean delivery: Secondary | ICD-10-CM | POA: Diagnosis not present

## 2023-06-05 DIAGNOSIS — O134 Gestational [pregnancy-induced] hypertension without significant proteinuria, complicating childbirth: Secondary | ICD-10-CM | POA: Diagnosis not present

## 2023-06-05 DIAGNOSIS — Z3A38 38 weeks gestation of pregnancy: Secondary | ICD-10-CM | POA: Diagnosis not present

## 2023-06-05 DIAGNOSIS — Z3A00 Weeks of gestation of pregnancy not specified: Secondary | ICD-10-CM | POA: Diagnosis not present

## 2023-06-05 DIAGNOSIS — O99344 Other mental disorders complicating childbirth: Secondary | ICD-10-CM | POA: Diagnosis not present

## 2023-06-05 LAB — CBC
HCT: 35.6 % — ABNORMAL LOW (ref 36.0–46.0)
Hemoglobin: 12.1 g/dL (ref 12.0–15.0)
MCH: 29 pg (ref 26.0–34.0)
MCHC: 34 g/dL (ref 30.0–36.0)
MCV: 85.4 fL (ref 80.0–100.0)
Platelets: 248 10*3/uL (ref 150–400)
RBC: 4.17 MIL/uL (ref 3.87–5.11)
RDW: 14.4 % (ref 11.5–15.5)
WBC: 16.2 10*3/uL — ABNORMAL HIGH (ref 4.0–10.5)
nRBC: 0 % (ref 0.0–0.2)

## 2023-06-05 LAB — PROTEIN / CREATININE RATIO, URINE
Creatinine, Urine: 103 mg/dL
Protein Creatinine Ratio: 3.26 mg/mg{creat} — ABNORMAL HIGH (ref 0.00–0.15)
Total Protein, Urine: 336 mg/dL

## 2023-06-05 LAB — RPR: RPR Ser Ql: NONREACTIVE

## 2023-06-05 MED ORDER — DIPHENHYDRAMINE HCL 50 MG/ML IJ SOLN: 12.5000 mg | INTRAMUSCULAR | Status: DC | PRN

## 2023-06-05 MED ORDER — IBUPROFEN 600 MG PO TABS
600.0000 mg | ORAL_TABLET | Freq: Four times a day (QID) | ORAL | Status: DC
  Administered 2023-06-05 – 2023-06-07 (×9): 600 mg via ORAL
  Filled 2023-06-05 (×9): qty 1

## 2023-06-05 MED ORDER — PHENYLEPHRINE 80 MCG/ML (10ML) SYRINGE FOR IV PUSH (FOR BLOOD PRESSURE SUPPORT)
80.0000 ug | PREFILLED_SYRINGE | INTRAVENOUS | Status: DC | PRN
  Filled 2023-06-05: qty 10

## 2023-06-05 MED ORDER — FENTANYL-BUPIVACAINE-NACL 0.5-0.125-0.9 MG/250ML-% EP SOLN
12.0000 mL/h | EPIDURAL | Status: DC | PRN
  Administered 2023-06-05: 12 mL/h via EPIDURAL
  Filled 2023-06-05: qty 250

## 2023-06-05 MED ORDER — NIFEDIPINE ER OSMOTIC RELEASE 30 MG PO TB24
30.0000 mg | ORAL_TABLET | Freq: Every day | ORAL | Status: DC
  Administered 2023-06-05 – 2023-06-07 (×3): 30 mg via ORAL
  Filled 2023-06-05 (×3): qty 1

## 2023-06-05 MED ORDER — COCONUT OIL OIL: 1.0000 | TOPICAL_OIL | Status: DC | PRN

## 2023-06-05 MED ORDER — ZOLPIDEM TARTRATE 5 MG PO TABS: 5.0000 mg | ORAL_TABLET | Freq: Every evening | ORAL | Status: DC | PRN

## 2023-06-05 MED ORDER — ONDANSETRON HCL 4 MG/2ML IJ SOLN: 4.0000 mg | INTRAMUSCULAR | Status: DC | PRN

## 2023-06-05 MED ORDER — LIDOCAINE HCL (PF) 1 % IJ SOLN
INTRAMUSCULAR | Status: DC | PRN
  Administered 2023-06-05 (×2): 4 mL via EPIDURAL

## 2023-06-05 MED ORDER — POTASSIUM CHLORIDE CRYS ER 20 MEQ PO TBCR
20.0000 meq | EXTENDED_RELEASE_TABLET | Freq: Every day | ORAL | Status: DC
  Administered 2023-06-06 – 2023-06-07 (×2): 20 meq via ORAL
  Filled 2023-06-05 (×2): qty 1

## 2023-06-05 MED ORDER — BENZOCAINE-MENTHOL 20-0.5 % EX AERO: 1.0000 | INHALATION_SPRAY | CUTANEOUS | Status: DC | PRN

## 2023-06-05 MED ORDER — EPHEDRINE 5 MG/ML INJ: 10.0000 mg | INTRAVENOUS | Status: DC | PRN

## 2023-06-05 MED ORDER — TETANUS-DIPHTH-ACELL PERTUSSIS 5-2.5-18.5 LF-MCG/0.5 IM SUSY: 0.5000 mL | PREFILLED_SYRINGE | Freq: Once | INTRAMUSCULAR | Status: DC

## 2023-06-05 MED ORDER — ONDANSETRON HCL 4 MG PO TABS: 4.0000 mg | ORAL_TABLET | ORAL | Status: DC | PRN

## 2023-06-05 MED ORDER — LACTATED RINGERS IV SOLN
500.0000 mL | Freq: Once | INTRAVENOUS | Status: AC
  Administered 2023-06-05: 500 mL via INTRAVENOUS

## 2023-06-05 MED ORDER — DIPHENHYDRAMINE HCL 25 MG PO CAPS: 25.0000 mg | ORAL_CAPSULE | Freq: Four times a day (QID) | ORAL | Status: DC | PRN

## 2023-06-05 MED ORDER — SENNOSIDES-DOCUSATE SODIUM 8.6-50 MG PO TABS
2.0000 | ORAL_TABLET | Freq: Every day | ORAL | Status: DC
  Administered 2023-06-06 – 2023-06-07 (×2): 2 via ORAL
  Filled 2023-06-05 (×2): qty 2

## 2023-06-05 MED ORDER — WITCH HAZEL-GLYCERIN EX PADS: 1.0000 | MEDICATED_PAD | CUTANEOUS | Status: DC | PRN

## 2023-06-05 MED ORDER — PRENATAL MULTIVITAMIN CH
1.0000 | ORAL_TABLET | Freq: Every day | ORAL | Status: DC
  Administered 2023-06-06 – 2023-06-07 (×2): 1 via ORAL
  Filled 2023-06-05 (×2): qty 1

## 2023-06-05 MED ORDER — SIMETHICONE 80 MG PO CHEW
80.0000 mg | CHEWABLE_TABLET | ORAL | Status: DC | PRN
  Administered 2023-06-05: 80 mg via ORAL
  Filled 2023-06-05: qty 1

## 2023-06-05 MED ORDER — DIBUCAINE (PERIANAL) 1 % EX OINT: 1.0000 | TOPICAL_OINTMENT | CUTANEOUS | Status: DC | PRN

## 2023-06-05 MED ORDER — PHENYLEPHRINE 80 MCG/ML (10ML) SYRINGE FOR IV PUSH (FOR BLOOD PRESSURE SUPPORT): 80.0000 ug | PREFILLED_SYRINGE | INTRAVENOUS | Status: DC | PRN

## 2023-06-05 MED ORDER — ACETAMINOPHEN 325 MG PO TABS
650.0000 mg | ORAL_TABLET | ORAL | Status: DC | PRN
  Administered 2023-06-05 – 2023-06-06 (×2): 650 mg via ORAL
  Filled 2023-06-05 (×2): qty 2

## 2023-06-05 MED ORDER — FUROSEMIDE 20 MG PO TABS
20.0000 mg | ORAL_TABLET | Freq: Every day | ORAL | Status: DC
  Administered 2023-06-06 – 2023-06-07 (×2): 20 mg via ORAL
  Filled 2023-06-05 (×2): qty 1

## 2023-06-05 NOTE — Anesthesia Procedure Notes (Signed)
 Epidural Patient location during procedure: OB Start time: 06/05/2023 2:46 AM End time: 06/05/2023 2:49 AM  Staffing Anesthesiologist: Vernadine Golas, MD Performed: anesthesiologist   Preanesthetic Checklist Completed: patient identified, IV checked, risks and benefits discussed, monitors and equipment checked, pre-op evaluation and timeout performed  Epidural Patient position: sitting Prep: DuraPrep and site prepped and draped Patient monitoring: continuous pulse ox, blood pressure and heart rate Approach: midline Location: L3-L4 Injection technique: LOR air  Needle:  Needle type: Tuohy  Needle gauge: 17 G Needle length: 9 cm Needle insertion depth: 6 cm Catheter type: closed end flexible Catheter size: 19 Gauge Catheter at skin depth: 11 cm Test dose: negative and Other (1% lidocaine )  Assessment Events: blood not aspirated, no cerebrospinal fluid, injection not painful, no injection resistance, no paresthesia and negative IV test  Additional Notes Patient identified. Risks, benefits, and alternatives discussed with patient including but not limited to bleeding, infection, nerve damage, paralysis, failed block, incomplete pain control, headache, blood pressure changes, nausea, vomiting, reactions to medication, itching, and postpartum back pain. Confirmed with bedside nurse the patient's most recent platelet count. Confirmed with patient that they are not currently taking any anticoagulation, have any bleeding history, or any family history of bleeding disorders. Patient expressed understanding and wished to proceed. All questions were answered. Sterile technique was used throughout the entire procedure. Please see nursing notes for vital signs.   Crisp LOR on first pass. Test dose was given through epidural catheter and negative prior to continuing to dose epidural or start infusion. Warning signs of high block given to the patient including shortness of breath,  tingling/numbness in hands, complete motor block, or any concerning symptoms with instructions to call for help. Patient was given instructions on fall risk and not to get out of bed. All questions and concerns addressed with instructions to call with any issues or inadequate analgesia.  Reason for block:procedure for pain

## 2023-06-05 NOTE — Anesthesia Preprocedure Evaluation (Signed)
 Anesthesia Evaluation  Patient identified by MRN, date of birth, ID band Patient awake    Reviewed: Allergy & Precautions, Patient's Chart, lab work & pertinent test results  History of Anesthesia Complications Negative for: history of anesthetic complications  Airway Mallampati: II  TM Distance: >3 FB Neck ROM: Full    Dental no notable dental hx.    Pulmonary asthma    Pulmonary exam normal        Cardiovascular hypertension (gHTN), Normal cardiovascular exam     Neuro/Psych   Anxiety        GI/Hepatic negative GI ROS, Neg liver ROS,,,  Endo/Other  negative endocrine ROS    Renal/GU negative Renal ROS     Musculoskeletal negative musculoskeletal ROS (+)    Abdominal   Peds  Hematology negative hematology ROS (+)   Anesthesia Other Findings Day of surgery medications reviewed with patient.  Reproductive/Obstetrics (+) Pregnancy (Hx of C/S x1)                             Anesthesia Physical Anesthesia Plan  ASA: 2  Anesthesia Plan: Epidural   Post-op Pain Management:    Induction:   PONV Risk Score and Plan: Treatment may vary due to age or medical condition  Airway Management Planned: Natural Airway  Additional Equipment: Fetal Monitoring  Intra-op Plan:   Post-operative Plan:   Informed Consent: I have reviewed the patients History and Physical, chart, labs and discussed the procedure including the risks, benefits and alternatives for the proposed anesthesia with the patient or authorized representative who has indicated his/her understanding and acceptance.       Plan Discussed with:   Anesthesia Plan Comments:        Anesthesia Quick Evaluation

## 2023-06-05 NOTE — Progress Notes (Signed)
 Labor Progress Note [place] Whelan is a 28 y.o. G2P1001 at [redacted]w[redacted]d presented for IOL for gHTN S: Asymptomatic, more comfortable with epidural. SROM clear just prior.   O:  BP (!) 143/86   Pulse 87   Temp 97.6 F (36.4 C) (Oral)   Resp 18   Ht 5' (1.524 m)   Wt 73.6 kg   LMP 09/07/2022   SpO2 98%   BMI 31.70 kg/m   EFM: baseline 135, accels, no decels, moderate variability TOCO: q1.5-2.5 min contractions  CVE: Dilation: 5 Effacement (%): 70 Station: -2 Presentation: Vertex Exam by:: Boyd Cabal, RN   A&P: 28 y.o. G2P1001 [redacted]w[redacted]d admitted for IOL #Labor: Progressing well. SROM. Continue to titrated pitocin  as needed.  #Pain: Epidural #FWB: Cat I #GBS negative  #gHTN - labs wnl, urine not yet run sending now (from foley)  Darrow End, MD 3:16 AM

## 2023-06-05 NOTE — Discharge Summary (Signed)
 Postpartum Discharge Summary      Patient Name: Cheyenne Garcia DOB: 07-20-1995 MRN: [id number]  Date of admission: 06/04/2023 Delivery date:06/05/2023 Delivering provider: Teena Feast Date of discharge: 06/07/2023  Admitting diagnosis: Gestational hypertension [O13.9] Intrauterine pregnancy: [redacted]w[redacted]d     Secondary diagnosis:  Principal Problem:   Gestational hypertension Active Problems:   S/P cesarean section   Previous baby with fetal growth restriction   Atypical squamous cells of undetermined significance (ASCUS) on Papanicolaou smear of cervix   Supervision of normal pregnancy   Anxiety  Additional problems:  Patient Active Problem List   Diagnosis Date Noted   Gestational hypertension 06/04/2023   Anxiety 03/25/2023   Supervision of normal pregnancy 11/14/2022   Atypical squamous cells of undetermined significance (ASCUS) on Papanicolaou smear of cervix 08/28/2021   History of gestational hypertension 12/07/2016   Previous baby with fetal growth restriction 11/30/2016   S/P cesarean section 07/09/2016       Discharge diagnosis: Term Pregnancy Delivered and Gestational Hypertension                                              Post partum procedures:none Augmentation: Pitocin  and IP Foley Complications: None Vacuum assisted delivery   Hospital course: Induction of Labor With Vaginal Delivery   28 y.o. yo [id number] at [redacted]w[redacted]d was admitted to the hospital 06/04/2023 for induction of labor.  Indication for induction: Gestational hypertension.  Patient had an labor course complicated by Vacuum assisted delivery after pushing for 2 hours.  Membrane Rupture Time/Date: 2:05 AM,06/05/2023  Delivery Method:VBAC, Vacuum Assisted Operative Delivery:Device used:Kiwi Indication: Fetal indications Episiotomy: None Lacerations:  Periurethral;1st degree Details of delivery can be found in separate delivery note.  Patient had a postpartum course complicated by continued HTN, placed on  Procardia /lasix .. Patient is discharged home 06/07/23.  Newborn Data: Birth date:06/05/2023 Birth time:1:02 PM Gender:Female Living status:Living Apgars:5 ,7  Weight:2710 g  Magnesium  Sulfate received: No BMZ received: No Rhophylac:N/A MMR:N/A T-DaP:Given prenatally Flu: N/A RSV Vaccine received: No Transfusion:No  Immunizations received: Immunization History  Administered Date(s) Administered   Tdap 10/05/2016, 05/20/2023    Physical exam  Vitals:   06/06/23 1356 06/06/23 2117 06/06/23 2217 06/07/23 0530  BP: 127/77 (!) 143/87 129/76 127/84  Pulse: 81 94 92 91  Resp: 16 17 17 18   Temp: 98.5 F (36.9 C) 98.5 F (36.9 C) 98.1 F (36.7 C) 98 F (36.7 C)  TempSrc: Oral Oral Oral Oral  SpO2:  99% 99% 100%  Weight:      Height:       General: alert, cooperative, and no distress Lochia: appropriate Uterine Fundus: firm Incision: N/A DVT Evaluation: No evidence of DVT seen on physical exam. Negative Homan's sign. No cords or calf tenderness. Labs: Lab Results  Component Value Date   WBC 16.2 (H) 06/05/2023   HGB 12.1 06/05/2023   HCT 35.6 (L) 06/05/2023   MCV 85.4 06/05/2023   PLT 248 06/05/2023      Latest Ref Rng & Units 06/04/2023    1:46 PM  CMP  Glucose 70 - 99 mg/dL 85   BUN 6 - 20 mg/dL 11   Creatinine 1.30 - 1.00 mg/dL 8.65   Sodium 784 - 696 mmol/L 135   Potassium 3.5 - 5.1 mmol/L 3.9   Chloride 98 - 111 mmol/L 105   CO2 22 - 32  mmol/L 19   Calcium 8.9 - 10.3 mg/dL 8.8   Total Protein 6.5 - 8.1 g/dL 6.5   Total Bilirubin 0.0 - 1.2 mg/dL 0.5   Alkaline Phos 38 - 126 U/L 228   AST 15 - 41 U/L 25   ALT 0 - 44 U/L 14    Edinburgh Score:    06/06/2023    5:50 AM  Edinburgh Postnatal Depression Scale Screening Tool  I have been able to laugh and see the funny side of things. 0  I have looked forward with enjoyment to things. 0  I have blamed myself unnecessarily when things went wrong. 0  I have been anxious or worried for no good reason. 1  I  have felt scared or panicky for no good reason. 1  Things have been getting on top of me. 0  I have been so unhappy that I have had difficulty sleeping. 0  I have felt sad or miserable. 0  I have been so unhappy that I have been crying. 0  The thought of harming myself has occurred to me. 0  Edinburgh Postnatal Depression Scale Total 2   Edinburgh Postnatal Depression Scale Total: 2   After visit meds:  Allergies as of 06/07/2023   No Known Allergies      Medication List     STOP taking these medications    aspirin  81 MG chewable tablet   metoCLOPramide  10 MG tablet Commonly known as: Reglan        TAKE these medications    furosemide  20 MG tablet Commonly known as: LASIX  Take 1 tablet (20 mg total) by mouth daily.   ibuprofen  600 MG tablet Commonly known as: ADVIL  Take 1 tablet (600 mg total) by mouth every 6 (six) hours.   multivitamin-prenatal 27-0.8 MG Tabs tablet Take 1 tablet by mouth daily at 12 noon.   NIFEdipine  30 MG 24 hr tablet Commonly known as: ADALAT  CC Take 1 tablet (30 mg total) by mouth daily.   potassium chloride  SA 20 MEQ tablet Commonly known as: KLOR-CON  M Take 1 tablet (20 mEq total) by mouth daily.         Discharge home in stable condition Infant Feeding: Breast Infant Disposition:home with mother Discharge instruction: per After Visit Summary and Postpartum booklet. Activity: Advance as tolerated. Pelvic rest for 6 weeks.  Diet: routine diet Future Appointments: Future Appointments  Date Time Provider Department Center  07/03/2023 10:30 AM Izell Marsh, MD [facility]   Follow up Visit:   Please schedule this patient for a In person postpartum visit in 6 weeks with the following provider: Any provider. Additional Postpartum F/U:BP check 1 week  Low risk pregnancy complicated by: HTN Delivery mode:  VBAC, Vacuum Assisted Anticipated Birth Control:  Unsure  Message sent on 06/05/23   06/07/2023 Majel Scott, CNM

## 2023-06-05 NOTE — Progress Notes (Signed)
 Patient pushing with contractions x1.5 hours. FHT Cat II with moderate variability however continued lates and variables despite position changes and extrauterine intervention. Called Dr. Ilona Malta to assess for vacuum delivery.    Talulah Schirmer Maurie Southern) Marlys Singh, MSN, CNM  [facility]  06/05/2023 12:43 PM

## 2023-06-05 NOTE — Progress Notes (Signed)
 Cheyenne Garcia is a 28 y.o. G2P1001 at [redacted]w[redacted]d by LMP admitted for induction of labor due to ghtn with TOLAC. Previous C/S x1 for NRNST.   Subjective: Patient it doing well and resting well with her epidural.   Objective: BP 127/77   Pulse 99   Temp 99.3 F (37.4 C) (Axillary)   Resp 18   Ht 5' (1.524 m)   Wt 73.6 kg   LMP 09/07/2022   SpO2 100%   BMI 31.70 kg/m  No intake/output data recorded. No intake/output data recorded.  FHT:  FHR: 150 bpm, variability: moderate,  accelerations:  Present,  decelerations:  Present occasional lates  but resolved with position changes UC:   regular, every 2-4 minutes SVE:   Dilation: 10 Effacement (%): 100 Station: 0 Exam by:: [place], rn  Labs: Lab Results  Component Value Date   WBC 16.2 (H) 06/05/2023   HGB 12.1 06/05/2023   HCT 35.6 (L) 06/05/2023   MCV 85.4 06/05/2023   PLT 248 06/05/2023   Patient Vitals for the past 24 hrs:  BP Temp Temp src Pulse Resp SpO2 Height Weight  06/05/23 1001 127/77 99.3 F (37.4 C) Axillary 99 18 -- -- --  06/05/23 0931 138/84 -- -- (!) 105 16 -- -- --  06/05/23 0914 133/77 -- -- (!) 102 16 -- -- --  06/05/23 0831 127/68 -- -- (!) 108 -- -- -- --  06/05/23 0801 125/78 97.9 F (36.6 C) Oral 100 -- -- -- --  06/05/23 0731 (!) 138/92 -- -- (!) 117 18 -- -- --  06/05/23 0701 135/83 -- -- 99 16 -- -- --  06/05/23 0700 -- -- -- -- -- 100 % -- --  06/05/23 0633 130/79 -- -- 91 16 99 % -- --  06/05/23 0602 137/79 97.9 F (36.6 C) Oral 91 -- 98 % -- --  06/05/23 0535 -- -- -- -- -- 96 % -- --  06/05/23 0531 122/73 -- -- 95 16 -- -- --  06/05/23 0500 130/83 -- -- 88 16 99 % -- --  06/05/23 0431 114/68 -- -- 80 16 -- -- --  06/05/23 0401 117/61 -- -- 80 16 -- -- --  06/05/23 0400 -- -- -- -- -- 98 % -- --  06/05/23 0342 -- 98.1 F (36.7 C) Oral -- -- -- -- --  06/05/23 0321 (!) 145/85 -- -- 85 16 -- -- --  06/05/23 0320 -- -- -- -- -- 100 % -- --  06/05/23 0316 (!) 144/82 -- -- 77 16 -- -- --   06/05/23 0315 -- -- -- -- -- 100 % -- --  06/05/23 0311 (!) 148/81 -- -- 99 16 -- -- --  06/05/23 0310 -- -- -- -- -- 100 % -- --  06/05/23 0306 (!) 143/89 -- -- 78 16 -- -- --  06/05/23 0305 -- -- -- -- -- 100 % -- --  06/05/23 0301 (!) 144/83 -- -- 92 16 -- -- --  06/05/23 0300 (!) 144/83 -- -- 92 16 100 % -- --  06/05/23 0256 (!) 150/89 -- -- 93 16 -- -- --  06/05/23 0250 (!) 151/91 -- -- 88 16 99 % -- --  06/05/23 0229 (!) 143/86 -- -- 87 18 -- -- --  06/05/23 0215 -- 97.6 F (36.4 C) Oral -- -- -- -- --  06/05/23 0021 (!) 142/87 -- -- (!) 102 -- -- -- --  06/04/23 2148 137/79 -- -- 87 -- -- -- --  06/04/23 1904 (!) 152/90 98.3 F (36.8 C) Oral 94 16 -- -- --  06/04/23 1712 (!) 126/91 -- -- 80 -- -- -- --  06/04/23 1347 135/81 98.7 F (37.1 C) Oral 90 16 98 % -- --  06/04/23 1336 -- -- -- -- -- -- 5' (1.524 m) 73.6 kg    Assessment / Plan: Induction of labor due to gthn with TOLAC,  progressing well on pitocin   Labor:  currently laboring down as patient has no urge to push. Plan to reassess in 1 hour.  Preeclampsia:   BPs stable following epidural and no signs of PreE at this time  Fetal Wellbeing:  Category II Pain Control:  Epidural I/D:   GBS negative  Anticipated MOD:  NSVD  Corie Diamond, CNM 06/05/2023, 10:26 AM

## 2023-06-05 NOTE — Lactation Note (Signed)
 This note was copied from a baby's chart. Lactation Consultation Note  Patient Name: Cheyenne Garcia Date: 06/05/2023 Age:26 hours Reason for consult: Initial assessment;Early term 37-38.6wks;Infant < 6lbs, See MR: hx of GHTN and VBAC.  MOB latched infant on her right breast using the football hold position, infant sustained his latch, swallows were heard, infant breast feed for 18 minutes. LC discussed how to hand express using breast model, MOB easily expressed of colostrum that was spoon fed to infant. MOB informed LC that she was cramping, LC explained this is normal first few days of life. MOB is happy infant is latching well at the breast, which is very different with her 1st child due to latch difficulties. MOB will continue to breastfeed infant 8+ times within 24 hours, skin to skin. MOB knows to call for further latch assistance if needed. LC discussed infant's input and output. The importance of maternal rest, balance meals and hydration. MOB was  made aware of O/P services, breastfeeding support groups, community resources, and our phone # for post-discharge questions.    MOB would like hand pump prior to hospital discharge. Maternal Data Has patient been taught Hand Expression?: Yes Does the patient have breastfeeding experience prior to this delivery?: Yes How long did the patient breastfeed?: Per MOB, she had latch difficulties with her 61 year old son and briefly BF mostly pumped for 3 months, MOB is glad her 2nd child is latching well at the breast.  Feeding Mother's Current Feeding Choice: Breast Milk  LATCH Score Latch: Grasps breast easily, tongue down, lips flanged, rhythmical sucking.  Audible Swallowing: Spontaneous and intermittent (swallows could be heard during the feeding.)  Type of Nipple: Everted at rest and after stimulation  Comfort (Breast/Nipple): Soft / non-tender  Hold (Positioning): Assistance needed to correctly position infant at breast and  maintain latch.  LATCH Score: 9   Lactation Tools Discussed/Used    Interventions Interventions: Breast feeding basics reviewed;Assisted with latch;Skin to skin;Breast compression;Adjust position;Support pillows;Position options;Expressed milk;Education;LC Services brochure  Discharge Pump:  (MOB would like hand pump prior to hospital discharged.)  Consult Status Consult Status: Follow-up Date: 06/06/23 Follow-up type: In-patient    Pecolia Bourbon 06/05/2023, 5:42 PM

## 2023-06-06 NOTE — Social Work (Signed)
 CSW received consult for hx of Anxiety and Depression.  CSW met with MOB to offer support and complete assessment.  CSW entered the room and observed MOB sitting on the bed, FOB at bedside and the infant in the bassinet. CSW introduced self, CSW role and reason for visit, MOB was agreeable to visit and allowed FOB to remain in the room. CSW inquired about how MOB was feeling, MOB reported good. CSW inquired about MOB MH hx, MOB reported she has dealt with anxiety and panic attacks for awhile but never discussed it with her doctor until her pregnancy when her symptoms began to increase. CSW inquired about treatment, MOB reports she takes a walk, and uses deep breathing techniques to help reduce her symptoms. CSW assessed for safety, MOB denied any SI or HI. CSW provided education regarding the baby blues period vs. perinatal mood disorders, discussed treatment and gave resources for mental health follow up if concerns arise.  CSW recommends self-evaluation during the postpartum time period using the New Mom Checklist from Postpartum Progress and encouraged MOB to contact a medical professional if symptoms are noted at any time.  MOB identified FOB and her mom as her supports.  CSW provided review of Sudden Infant Death Syndrome (SIDS) precautions.  MOB reported she has all necessary items for the infant including a bassinet and car seat.  CSW identifies no further need for intervention and no barriers to discharge at this time.  Aspin Palomarez, LCSWA Clinical Social Worker [phone]

## 2023-06-06 NOTE — Lactation Note (Signed)
 This note was copied from a baby's chart. Lactation Consultation Note  Patient Name: Cheyenne Garcia [id number] Date: 06/06/2023 Age:26 hours Reason for consult: Follow-up assessment;Early term 37-38.6wks;Infant < 6lbs, weight loss of -6.45%.  MOB is  currently latching infant for 15 minutes and afterwards she is supplementing infant with 22 kcal formula . Infant recently breastfeed for 15 minutes and consumed 20 mls of 22 kcal formula at 1830 pm. MOB will continue to breastfeed infant every 3 hours. MOB declined using hand or DEBP she  only wants to latch and supplement infant with formula. LC gave MOB 'MY Plan Card " with supplement amounts for LPTI/ LBW  on Day 2 MOB will offer infant after latching infant at the breast ( 18-21 mls of 22 kcal formula ) or more if infant wants it per feeding. MOB plans to continue to supplement infant until he regains his birth weight. MOB knows to call if she has breastfeeding questions, concerns or need latch assistance.   Maternal Data    Feeding Mother's Current Feeding Choice: Breast Milk and Formula  LATCH Score  LC did not observe latch at this time.                   Lactation Tools Discussed/Used    Interventions Interventions: Expressed milk;Education;Pace feeding;LPT handout/interventions  Discharge    Consult Status Consult Status: Follow-up Date: 06/07/23 Follow-up type: In-patient    Cheyenne Garcia 06/06/2023, 8:34 PM

## 2023-06-06 NOTE — Progress Notes (Signed)
 Post Partum Day 1 Subjective: No complaints, up ad lib, voiding, tolerating PO, and + flatus.  Patient denies any headaches, visual symptoms, RUQ/epigastric pain or other concerning symptoms. Breastfeeding, moderate lochia reported. Baby stable at bedside. FOB in room.  Objective: Blood pressure 119/62, pulse 88, temperature 97.7 F (36.5 C), temperature source Oral, resp. rate 16, height 5' (1.524 m), weight 73.6 kg, last menstrual period 09/07/2022, SpO2 100%, unknown if currently breastfeeding.  Physical Exam:  General: alert and no distress Lochia: appropriate Uterine Fundus: firm DVT Evaluation: No evidence of DVT seen on physical exam. Negative Homan's sign.No cords or calf tenderness. No significant calf/ankle edema.  Recent Labs    06/04/23 1347 06/05/23 0121  HGB 12.5 12.1  HCT 37.0 35.6*    Assessment/Plan: Stable BP, continue Procardia  XL 30 mg po daily and Lasix  20 mg daily x 5 days.  Potassium supplementation also ordered. Breastfeeding, and Contraception Unsure Plan for discharge tomorrow   LOS: 2 days   Lenoard Rad, MD 06/06/2023, 8:23 AM

## 2023-06-06 NOTE — Anesthesia Postprocedure Evaluation (Signed)
 Anesthesia Post Note  Patient: Cheyenne Garcia  Procedure(s) Performed: AN AD HOC LABOR EPIDURAL     Patient location during evaluation: Mother Baby Anesthesia Type: Epidural Level of consciousness: awake, oriented and awake and alert Pain management: pain level controlled Vital Signs Assessment: post-procedure vital signs reviewed and stable Respiratory status: spontaneous breathing, nonlabored ventilation and respiratory function stable Cardiovascular status: stable Postop Assessment: no headache, patient able to bend at knees, adequate PO intake, able to ambulate and no apparent nausea or vomiting Anesthetic complications: no   No notable events documented.  Last Vitals:  Vitals:   06/06/23 0100 06/06/23 0545  BP: 134/72 119/62  Pulse: 94 88  Resp: 17 16  Temp: 36.6 C 36.5 C  SpO2: 100% 100%    Last Pain:  Vitals:   06/06/23 0723  TempSrc:   PainSc: 0-No pain   Pain Goal:                   Suhas Estis

## 2023-06-07 ENCOUNTER — Other Ambulatory Visit (HOSPITAL_COMMUNITY): Payer: Self-pay

## 2023-06-07 MED ORDER — FUROSEMIDE 20 MG PO TABS
20.0000 mg | ORAL_TABLET | Freq: Every day | ORAL | 0 refills | Status: DC
  Filled 2023-06-07: qty 4, 4d supply, fill #0

## 2023-06-07 MED ORDER — NIFEDIPINE ER 30 MG PO TB24
30.0000 mg | ORAL_TABLET | Freq: Every day | ORAL | 0 refills | Status: DC
Start: 2023-06-07 — End: 2023-07-11
  Filled 2023-06-07: qty 90, 90d supply, fill #0

## 2023-06-07 MED ORDER — IBUPROFEN 600 MG PO TABS
600.0000 mg | ORAL_TABLET | Freq: Four times a day (QID) | ORAL | 0 refills | Status: DC
Start: 2023-06-07 — End: 2023-07-11
  Filled 2023-06-07: qty 30, 8d supply, fill #0

## 2023-06-07 MED ORDER — POTASSIUM CHLORIDE CRYS ER 20 MEQ PO TBCR
20.0000 meq | EXTENDED_RELEASE_TABLET | Freq: Every day | ORAL | 0 refills | Status: DC
  Filled 2023-06-07: qty 4, 4d supply, fill #0

## 2023-06-07 NOTE — Lactation Note (Signed)
 This note was copied from a baby's chart. Lactation Consultation Note  Patient Name: Cheyenne Garcia [id number] Date: 06/07/2023 Age:26 hours Reason for consult: Follow-up assessment;Maternal discharge;Early term 37-38.6wks  P2 - MOB holding infant at [facility] entry. FOB present. LC gathered feeding hx for today and determined that infant is feeding well at breast and taking formula well. Parents denied having any issues at this time. PT has OP appt scheduled at OP clinic ([facility]). This LC reviewed engorgement treatment and maternal nutrition and hydration. Recommended calling out this evening if not discharging home, otherwise lactation consult will be made complete. Wished family well.   Maternal Data    Feeding Mother's Current Feeding Choice: Breast Milk and Formula     Interventions Interventions: Breast feeding basics reviewed;Education  Discharge Discharge Education: Engorgement and breast care;Warning signs for feeding baby;Outpatient recommendation  Consult Status Consult Status: Complete Date: 06/07/23    Ardia Kraft 06/07/2023, 12:35 PM

## 2023-06-07 NOTE — Patient Instructions (Signed)
 Your appointment with Outpatient Lactation is: Date 06/26/2023 Time 8:30 am [facility] (First Floor) [address] [place]  Check in under baby's name.  Please bring your baby hungry along with your pump and a bottle of either formula or expressed breast milk. Please also bring your pump flanges and we welcome support people! If you need lactation assistance before your appointment, please call [phone] and press 4 for lactation.

## 2023-06-08 ENCOUNTER — Encounter: Payer: Self-pay | Admitting: Obstetrics and Gynecology

## 2023-06-09 ENCOUNTER — Encounter: Payer: Self-pay | Admitting: Obstetrics and Gynecology

## 2023-06-11 ENCOUNTER — Encounter: Admitting: Obstetrics and Gynecology

## 2023-06-12 ENCOUNTER — Ambulatory Visit

## 2023-06-12 VITALS — BP 133/83 | HR 89 | Wt 145.0 lb

## 2023-06-12 DIAGNOSIS — Z0130 Encounter for examination of blood pressure without abnormal findings: Secondary | ICD-10-CM

## 2023-06-12 NOTE — Progress Notes (Signed)
 Subjective:  Cheyenne Garcia is a 28 y.o. female here for BP check.   Hypertension ROS: taking medications as instructed, no medication side effects noted, no TIA's, no chest pain on exertion, no dyspnea on exertion, and no swelling of ankles.    Objective:  BP 133/83 (BP Location: Right Arm, Patient Position: Sitting, Cuff Size: Normal)   Pulse 89   Wt 145 lb (65.8 kg)   LMP 09/07/2022   Breastfeeding Yes   BMI 28.32 kg/m   Appearance alert, well appearing, and in no distress. General exam BP noted to be well controlled today in office.    Assessment:   Blood Pressure stable.   Plan:  Current treatment plan is effective, no change in therapy.  Summit Arroyave l Javonni Macke, CMA

## 2023-06-17 DIAGNOSIS — Z419 Encounter for procedure for purposes other than remedying health state, unspecified: Secondary | ICD-10-CM | POA: Diagnosis not present

## 2023-07-03 ENCOUNTER — Ambulatory Visit: Admitting: Obstetrics and Gynecology

## 2023-07-11 ENCOUNTER — Encounter: Payer: Self-pay | Admitting: Obstetrics and Gynecology

## 2023-07-11 ENCOUNTER — Ambulatory Visit: Admitting: Obstetrics and Gynecology

## 2023-07-11 ENCOUNTER — Other Ambulatory Visit (HOSPITAL_COMMUNITY)
Admission: RE | Admit: 2023-07-11 | Discharge: 2023-07-11 | Disposition: A | Discharge status: Home / Self Care | Source: Ambulatory Visit | Attending: Obstetrics and Gynecology | Admitting: Obstetrics and Gynecology

## 2023-07-11 VITALS — BP 130/85 | HR 106 | Ht 60.0 in | Wt 144.0 lb

## 2023-07-11 DIAGNOSIS — R3915 Urgency of urination: Secondary | ICD-10-CM

## 2023-07-11 DIAGNOSIS — Z8759 Personal history of other complications of pregnancy, childbirth and the puerperium: Secondary | ICD-10-CM

## 2023-07-11 DIAGNOSIS — M62838 Other muscle spasm: Secondary | ICD-10-CM | POA: Diagnosis not present

## 2023-07-11 DIAGNOSIS — R87610 Atypical squamous cells of undetermined significance on cytologic smear of cervix (ASC-US): Secondary | ICD-10-CM | POA: Insufficient documentation

## 2023-07-11 MED ORDER — SLYND 4 MG PO TABS: 1.0000 | ORAL_TABLET | Freq: Every day | ORAL | 3 refills | Status: AC

## 2023-07-11 NOTE — Progress Notes (Signed)
 Post Partum Visit Note  Cheyenne Garcia is a 28 y.o. [id number] female who presents for a postpartum visit. She is 5 weeks postpartum following a normal [facility]- vaginal delivery.  I have fully reviewed the prenatal and intrapartum course. The delivery was at 38wk 5d gestational weeks.  Anesthesia: epidural. Postpartum course has been good. Baby is doing well. Baby is feeding by breast. Bleeding no bleeding. Bowel function is normal. Bladder function is normal. Patient is not sexually active. Contraception method is none. Postpartum depression screening: negative.   The pregnancy intention screening data noted above was reviewed. Potential methods of contraception were discussed. The patient elected to proceed with No data recorded.   Edinburgh Postnatal Depression Scale - 07/11/23 1615       Edinburgh Postnatal Depression Scale:  In the Past 7 Days   I have been able to laugh and see the funny side of things. 0    I have looked forward with enjoyment to things. 0    I have blamed myself unnecessarily when things went wrong. 0    I have been anxious or worried for no good reason. 1    I have felt scared or panicky for no good reason. 0    Things have been getting on top of me. 1    I have been so unhappy that I have had difficulty sleeping. 0    I have felt sad or miserable. 0    I have been so unhappy that I have been crying. 0    The thought of harming myself has occurred to me. 0    Edinburgh Postnatal Depression Scale Total 2             Health Maintenance Due  Topic Date Due   HPV VACCINES (1 - 3-dose series) Never done   COVID-19 Vaccine (1 - 2024-25 season) Never done    The following portions of the patient's history were reviewed and updated as appropriate: allergies, current medications, past family history, past medical history, past social history, past surgical history, and problem list.  Review of Systems Pertinent items are noted in HPI.  Objective:  BP 130/85    Pulse (!) 106   Ht 5' (1.524 m)   Wt 144 lb (65.3 kg)   LMP 09/07/2022   Breastfeeding Yes   BMI 28.12 kg/m    General:  alert, cooperative, and no distress   Breasts:  not indicated  Lungs: Normal effort  Heart:  Normal rate  Abdomen: soft, non-tender; bowel sounds normal; no masses,  no organomegaly   Wound NA  GU exam:  normal, sutures not fully dissolved from right periurethral.        Assessment:    1. History of gestational hypertension Discussed ldASA 162 mg next pregnancy  2. Postpartum care and examination  3. Atypical squamous cells of undetermined significance (ASCUS) on Papanicolaou smear of cervix Cotesting today   Plan:   Essential components of care per ACOG recommendations:  1.  Mood and well being: Patient with negative depression screening today. Reviewed local resources for support.  - Patient tobacco use? No.   - hx of drug use? No.    2. Infant care and feeding:  -Patient currently breastmilk feeding? No.  -Social determinants of health (SDOH) reviewed in EPIC. No concerns  3. Sexuality, contraception and birth spacing - Patient does not want a pregnancy in the next year.  Desired family size is 3-4 children.  - Reviewed reproductive life  planning. Reviewed contraceptive methods based on pt preferences and effectiveness.  Patient desired Oral Contraceptive today.   - Discussed birth spacing of 18 months  4. Sleep and fatigue -Encouraged family/partner/community support of 4 hrs of uninterrupted sleep to help with mood and fatigue  5. Physical Recovery  - Discussed patients delivery and complications. She describes her labor as good. - Patient had a Vaginal, no problems at delivery. Patient had a 1st degree laceration. Perineal healing reviewed. Patient expressed understanding - Patient has urinary incontinence? No. She does have urinary urgency. Will check Ucx but suspect d/t pelvic floor dysfunction. Will do PFPT.  - Patient is safe to resume  physical and sexual activity in 2 weeks  6.  Health Maintenance - HM due items addressed Yes - Last pap smear  Diagnosis  Date Value Ref Range Status  07/04/2022 (A)  Final   - Atypical squamous cells of undetermined significance (ASC-US )   Pap smear done at today's visit.  -Breast Cancer screening indicated? No.   7. Chronic Disease/Pregnancy Condition follow up: None  - PCP follow up  Lacey Pian, MD [facility], [facility]

## 2023-07-13 LAB — URINE CULTURE: Organism ID, Bacteria: NO GROWTH

## 2023-07-13 LAB — SPECIMEN STATUS REPORT

## 2023-07-17 ENCOUNTER — Telehealth: Payer: Self-pay

## 2023-07-18 DIAGNOSIS — Z419 Encounter for procedure for purposes other than remedying health state, unspecified: Secondary | ICD-10-CM | POA: Diagnosis not present

## 2023-07-21 ENCOUNTER — Ambulatory Visit: Payer: Self-pay | Admitting: Obstetrics and Gynecology

## 2023-07-22 ENCOUNTER — Encounter: Payer: Self-pay | Admitting: Obstetrics and Gynecology

## 2023-07-24 NOTE — Telephone Encounter (Signed)
 Opened in error

## 2023-07-31 ENCOUNTER — Telehealth: Payer: Self-pay

## 2023-07-31 ENCOUNTER — Encounter: Payer: Self-pay | Admitting: Obstetrics and Gynecology

## 2023-07-31 NOTE — Telephone Encounter (Signed)
 RN received mychart message regarding questions about how to take Slynd  birth control and what the green and white pills were. RN educated and explained how to take and answered questions, discussed importance of taking at same time every day. Patient verbalized understanding.  Silvano LELON Piano, RN

## 2023-08-17 DIAGNOSIS — Z419 Encounter for procedure for purposes other than remedying health state, unspecified: Secondary | ICD-10-CM | POA: Diagnosis not present

## 2023-08-26 DIAGNOSIS — R6883 Chills (without fever): Secondary | ICD-10-CM | POA: Diagnosis not present

## 2023-08-26 DIAGNOSIS — R519 Headache, unspecified: Secondary | ICD-10-CM | POA: Diagnosis not present

## 2023-08-26 NOTE — Progress Notes (Signed)
 Subjective   HPI:  Cheyenne Garcia is a 28 y.o.  female who presents to the office with:  Chief Complaint  Patient presents with  . Headache    Here today for headache that began last night, feeling like she was going to faint and also having dizziness. Has tried tylenol  and motrin . States mostly in the temple region, no vomiting but has been nauseous and feeling feverish.  Started OCP about a month ago.    Patient presents today with concerns of headaches.  She states that it began last night.  She reported feeling lightheaded and dizzy.  She took Tylenol  but this did not relieve her headache.  She went to sleep and when she awoke this morning she felt feverish with some chills.  She did not have any body aches.  Patient has not noticed any other URI symptoms including sore throat, ear pain, cough or congestion.  This morning she took Motrin  around 8 AM which did help to alleviate her pain.  Around 12:30pm, her headache started to return.  She has not noticed any body aches, nausea vomiting or diarrhea.  Patient is currently breast-feeding, 2 months postpartum.  There are no known sick contacts in the household.  No new rashes.  No vision changes, slurred speech, facial droop, muscle weakness.   PMH: Past medical history, Past surgical history, Social history, family history were reviewed as noted in EMR.  Pertinent for:  History reviewed. No pertinent past medical history.   Medications and allergies reviewed.  ROS: All systems were negative including constitutional, CV, respiratory, GI/GU except for those noted in the HPI.  Objective   Vital Signs: BP 112/70   Pulse 115   Temp 97.7 F (36.5 C) (Temporal)   Ht 5' (1.524 m)   Wt 146 lb (66.2 kg)   LMP 07/29/2023 (Exact Date)   SpO2 98%   Breastfeeding Yes   BMI 28.51 kg/m   Wt Readings from Last 3 Encounters:  08/26/23 146 lb (66.2 kg)  04/25/23 150 lb 3.2 oz (68.1 kg)  08/17/21 98 lb 9.6 oz (44.7 kg)   Physical Exam Vitals  and nursing note reviewed.  Constitutional:      General: She is not in acute distress. HENT:     Head: Normocephalic and atraumatic.     Right Ear: Tympanic membrane and ear canal normal.     Left Ear: Tympanic membrane and ear canal normal.     Nose: Nose normal.     Mouth/Throat:     Mouth: Mucous membranes are moist.     Pharynx: Oropharynx is clear. Uvula midline. No oropharyngeal exudate or posterior oropharyngeal erythema.     Tonsils: No tonsillar exudate or tonsillar abscesses.   Eyes:     General: Lids are normal. No scleral icterus.    Extraocular Movements: Extraocular movements intact.     Right eye: No nystagmus.     Left eye: No nystagmus.     Conjunctiva/sclera: Conjunctivae normal.     Pupils: Pupils are equal, round, and reactive to light.     Funduscopic exam:    Right eye: No hemorrhage or papilledema.        Left eye: No hemorrhage or papilledema.    Cardiovascular:     Rate and Rhythm: Normal rate and regular rhythm.     Heart sounds: Normal heart sounds. No murmur heard. Pulmonary:     Effort: Pulmonary effort is normal. No respiratory distress.     Breath sounds: Normal  breath sounds. No wheezing.  Abdominal:     Palpations: Abdomen is soft.     Tenderness: There is no abdominal tenderness.   Musculoskeletal:        General: Normal range of motion.     Cervical back: Normal range of motion and neck supple. No rigidity.  Lymphadenopathy:     Cervical: No cervical adenopathy.   Skin:    General: Skin is warm and dry.     Findings: No rash.   Neurological:     Mental Status: She is alert and oriented to person, place, and time.     Cranial Nerves: No cranial nerve deficit or facial asymmetry.     Motor: No weakness.     Coordination: Romberg sign negative. Coordination normal. Finger-Nose-Finger Test and Heel to [facility] Test normal.     Gait: Gait is intact. Gait normal.     Deep Tendon Reflexes: Reflexes are normal and symmetric.   Psychiatric:         Mood and Affect: Mood and affect normal.        Cognition and Memory: Memory normal.        Judgment: Judgment normal.      Assessment/Plan   No orders of the defined types were placed in this encounter.   1. Nonintractable episodic headache, unspecified headache type   2. Chills    Acute onset of headache last night.  Responded to Aleve.  Recommended to continue with Aleve and Tylenol .  Patient is currently breast-feeding so we will avoid any other additional treatment with Toradol .  Recommended continued supportive measures including increased hydration, rest.  Patient is without any other URI symptoms.  Offered testing for viral etiologies.  Patient declines.  Patient afebrile and nontoxic-appearing in office.  No known focal neurological deficits or focal neurological deficits identified on exam.  Patient and husband will monitor symptoms closely and follow-up if any symptoms change or worsen.  She states understanding is agreeable plan.  No problem-specific Assessment & Plan notes found for this encounter.   No follow-ups on file.  Future Appointments  Date Time Provider Department Center  10/25/2023  9:45 AM Lannie Gell, PA-C [facility] None    Medications at end of visit today: Current Medications[1]  Patient Care Team: Lannie Gell, PA-C as PCP - General (Physician Assistant)         [1]  Current Outpatient Medications:  .  drospirenone  (SLYND ) 4 mg TABS tablet, Take one tablet (4 mg dose) by mouth daily., Disp: , Rfl:  .  prenatal multivitamin (PRENATAL) TABS, Take one tablet by mouth daily., Disp: , Rfl:

## 2023-08-27 ENCOUNTER — Encounter: Payer: Self-pay | Admitting: Obstetrics and Gynecology

## 2023-09-17 DIAGNOSIS — Z419 Encounter for procedure for purposes other than remedying health state, unspecified: Secondary | ICD-10-CM | POA: Diagnosis not present

## 2023-09-30 ENCOUNTER — Ambulatory Visit: Admitting: Physical Therapy

## 2023-09-30 ENCOUNTER — Telehealth: Payer: Self-pay | Admitting: Physical Therapy

## 2023-09-30 NOTE — Telephone Encounter (Signed)
 Left patient a voicemail re cancelled appt today- if she wants to reschedule, she can call us  at [phone]

## 2023-10-04 DIAGNOSIS — R1033 Periumbilical pain: Secondary | ICD-10-CM | POA: Diagnosis not present

## 2023-10-04 DIAGNOSIS — K429 Umbilical hernia without obstruction or gangrene: Secondary | ICD-10-CM | POA: Diagnosis not present

## 2023-10-18 DIAGNOSIS — Z419 Encounter for procedure for purposes other than remedying health state, unspecified: Secondary | ICD-10-CM | POA: Diagnosis not present

## 2023-10-18 DIAGNOSIS — K429 Umbilical hernia without obstruction or gangrene: Secondary | ICD-10-CM | POA: Diagnosis not present

## 2023-10-18 DIAGNOSIS — R7303 Prediabetes: Secondary | ICD-10-CM | POA: Diagnosis not present

## 2023-10-31 DIAGNOSIS — E663 Overweight: Secondary | ICD-10-CM | POA: Diagnosis not present

## 2023-10-31 DIAGNOSIS — R7303 Prediabetes: Secondary | ICD-10-CM | POA: Diagnosis not present

## 2023-10-31 DIAGNOSIS — K429 Umbilical hernia without obstruction or gangrene: Secondary | ICD-10-CM | POA: Diagnosis not present

## 2023-10-31 DIAGNOSIS — Z0000 Encounter for general adult medical examination without abnormal findings: Secondary | ICD-10-CM | POA: Diagnosis not present

## 2023-11-11 DIAGNOSIS — K429 Umbilical hernia without obstruction or gangrene: Secondary | ICD-10-CM | POA: Diagnosis not present

## 2023-11-11 DIAGNOSIS — K420 Umbilical hernia with obstruction, without gangrene: Secondary | ICD-10-CM | POA: Diagnosis not present

## 2023-12-11 DIAGNOSIS — K429 Umbilical hernia without obstruction or gangrene: Secondary | ICD-10-CM | POA: Diagnosis not present

## 2023-12-18 DIAGNOSIS — Z419 Encounter for procedure for purposes other than remedying health state, unspecified: Secondary | ICD-10-CM | POA: Diagnosis not present
